# Patient Record
Sex: Male | Born: 1960 | Race: White | Hispanic: No | Marital: Married | Smoking: Former smoker
Health system: Southern US, Community
[De-identification: ages and names within clinical notes are randomized; demographics above are authoritative.]

## PROBLEM LIST (undated history)

## (undated) DIAGNOSIS — Z9889 Other specified postprocedural states: Secondary | ICD-10-CM

## (undated) DIAGNOSIS — H269 Unspecified cataract: Secondary | ICD-10-CM

## (undated) DIAGNOSIS — S61219A Laceration without foreign body of unspecified finger without damage to nail, initial encounter: Secondary | ICD-10-CM

## (undated) HISTORY — DX: Other specified postprocedural states: Z98.890

## (undated) HISTORY — DX: Unspecified cataract: H26.9

## (undated) HISTORY — DX: Laceration without foreign body of unspecified finger without damage to nail, initial encounter: S61.219A

---

## 2000-08-18 HISTORY — PX: FOOT SURGERY: SHX648

## 2005-01-01 ENCOUNTER — Ambulatory Visit (HOSPITAL_COMMUNITY): Admission: RE | Admit: 2005-01-01 | Discharge: 2005-01-01 | Payer: Self-pay | Admitting: *Deleted

## 2005-12-08 IMAGING — CR DG CHEST 2V
2 series · 2 of 2 positions shown · non-contrast
Comparison: none

CLINICAL DATA: Chest pain; shortness of breath; pre-cath work-up
 CHEST - 2 VIEW: 
 Hyperaerated lungs.  No active pulmonary process.  Normal cardiomediastinal silhouette size and contours.

[view not recorded (1 of 2)]
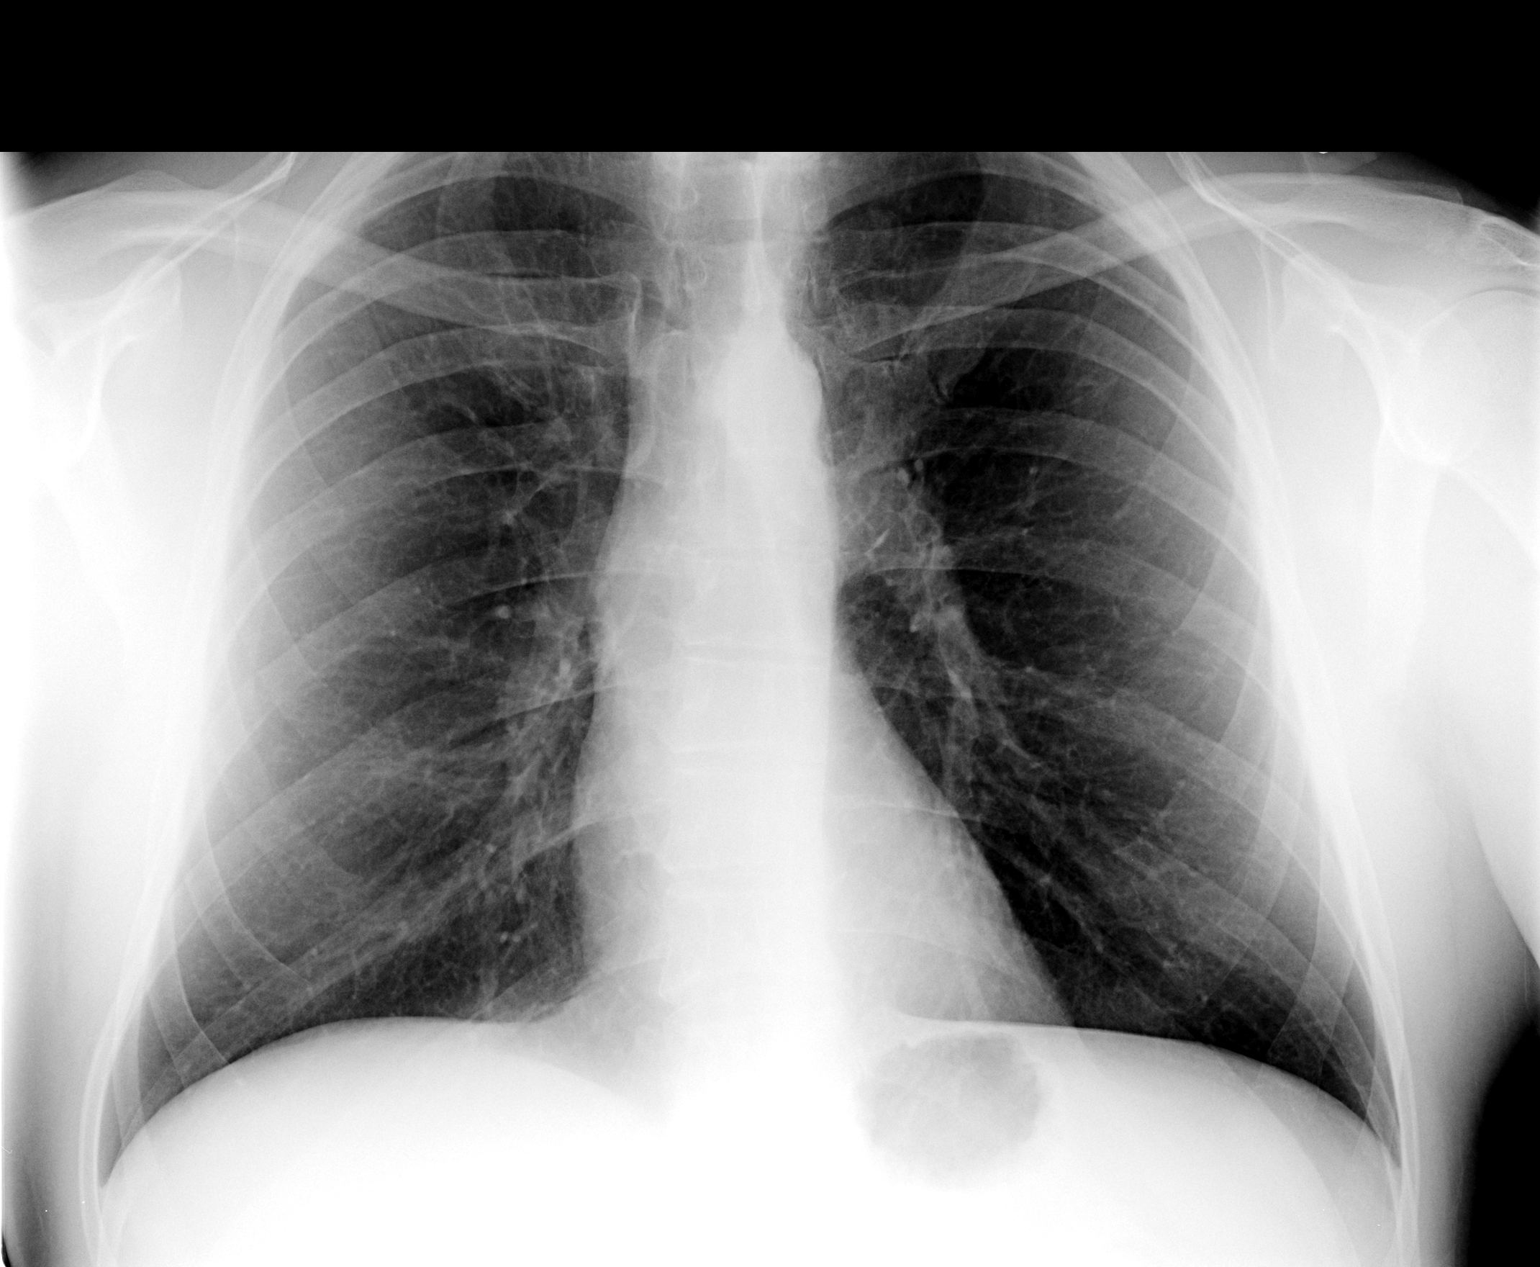

[view not recorded (2 of 2)]
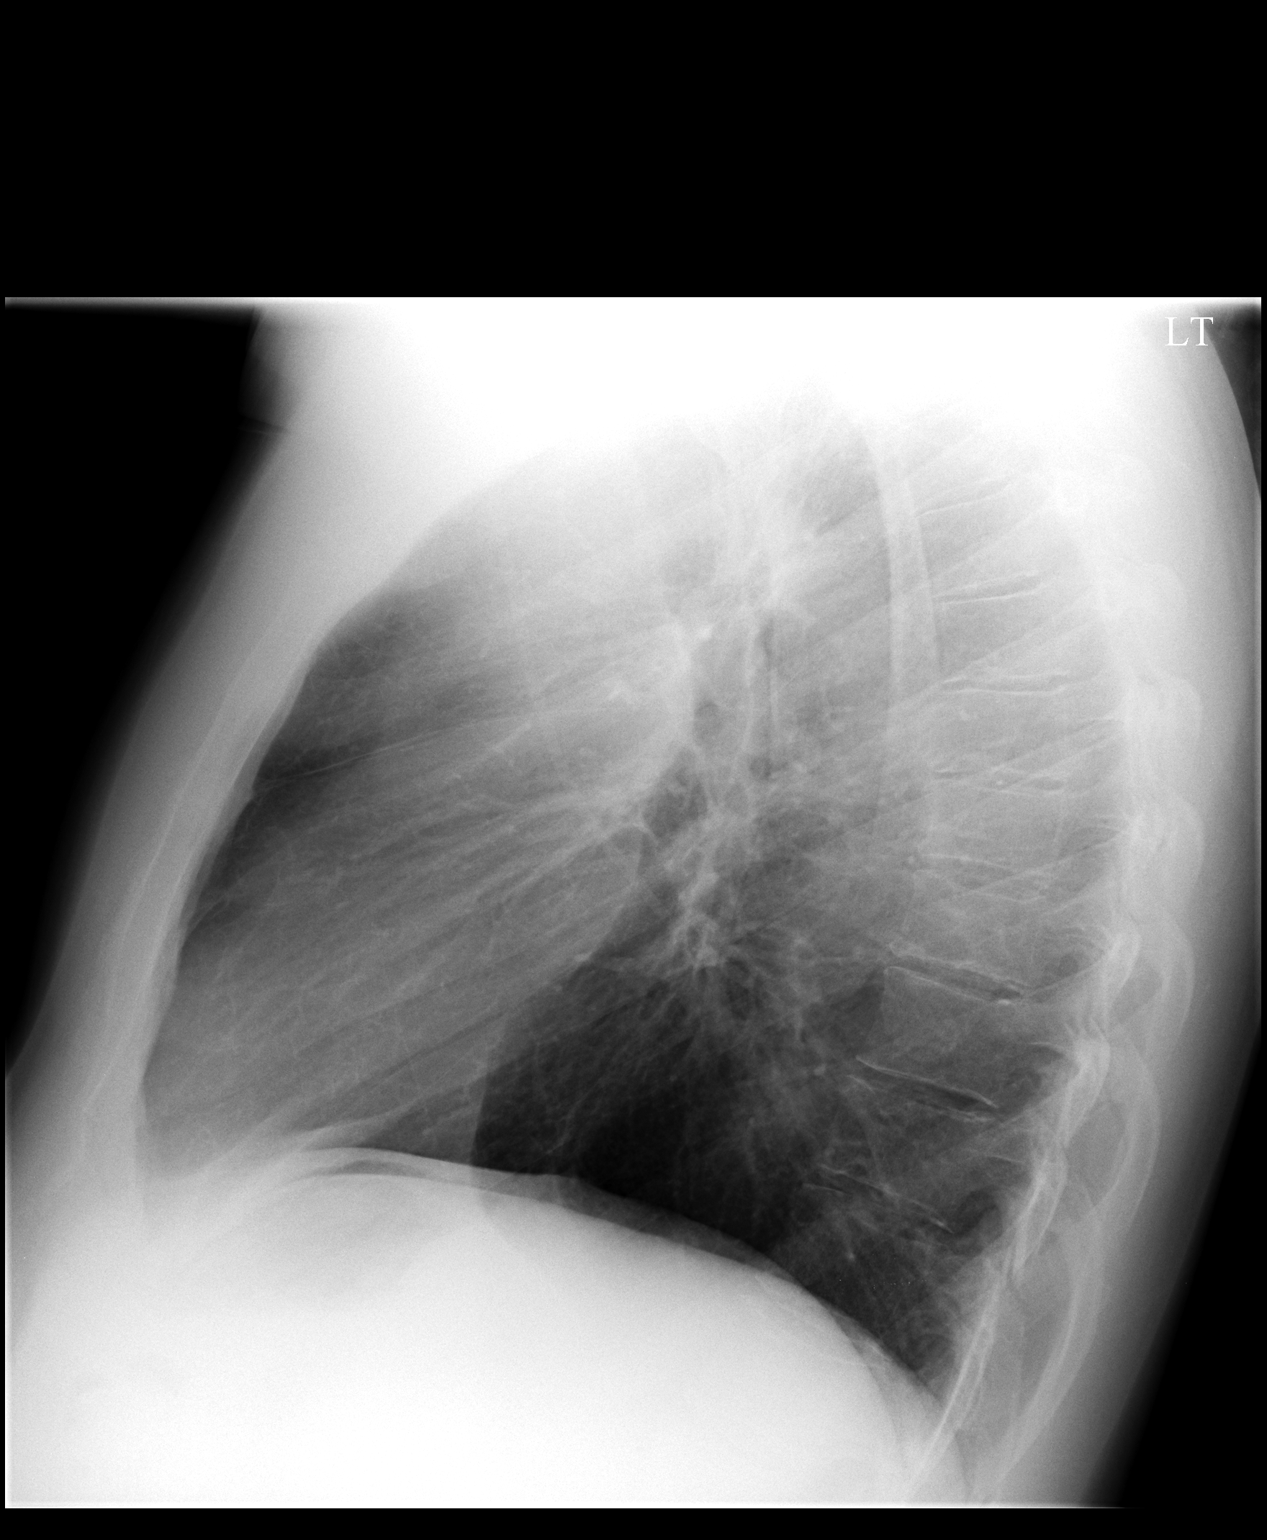

[2 of 2 positions shown; findings below may reference images not displayed]

IMPRESSION: Radiographic findings suspicious for COPD.  No acute chest disease.

## 2009-06-15 LAB — HM COLONOSCOPY

## 2012-05-20 ENCOUNTER — Ambulatory Visit: Payer: Self-pay | Admitting: Family Medicine

## 2015-06-08 DIAGNOSIS — M19011 Primary osteoarthritis, right shoulder: Secondary | ICD-10-CM | POA: Insufficient documentation

## 2015-06-27 DIAGNOSIS — G8929 Other chronic pain: Secondary | ICD-10-CM | POA: Insufficient documentation

## 2015-06-27 DIAGNOSIS — M25511 Pain in right shoulder: Secondary | ICD-10-CM

## 2015-12-13 DIAGNOSIS — H903 Sensorineural hearing loss, bilateral: Secondary | ICD-10-CM | POA: Insufficient documentation

## 2015-12-13 DIAGNOSIS — H9319 Tinnitus, unspecified ear: Secondary | ICD-10-CM | POA: Insufficient documentation

## 2016-09-09 DIAGNOSIS — K2100 Gastro-esophageal reflux disease with esophagitis, without bleeding: Secondary | ICD-10-CM | POA: Insufficient documentation

## 2016-09-09 DIAGNOSIS — L409 Psoriasis, unspecified: Secondary | ICD-10-CM | POA: Insufficient documentation

## 2016-09-09 DIAGNOSIS — E291 Testicular hypofunction: Secondary | ICD-10-CM | POA: Insufficient documentation

## 2016-09-10 ENCOUNTER — Encounter: Payer: Self-pay | Admitting: Family Medicine

## 2016-09-10 ENCOUNTER — Ambulatory Visit
Admission: RE | Admit: 2016-09-10 | Discharge: 2016-09-10 | Disposition: A | Discharge status: Home / Self Care | Payer: Managed Care, Other (non HMO) | Source: Ambulatory Visit | Attending: Family Medicine | Admitting: Family Medicine

## 2016-09-10 ENCOUNTER — Ambulatory Visit (INDEPENDENT_AMBULATORY_CARE_PROVIDER_SITE_OTHER): Payer: Managed Care, Other (non HMO) | Admitting: Family Medicine

## 2016-09-10 VITALS — BP 110/80 | HR 72 | Temp 98.3°F | Resp 16 | Ht 73.0 in | Wt 203.0 lb

## 2016-09-10 DIAGNOSIS — Z5181 Encounter for therapeutic drug level monitoring: Secondary | ICD-10-CM | POA: Diagnosis not present

## 2016-09-10 DIAGNOSIS — M940 Chondrocostal junction syndrome [Tietze]: Secondary | ICD-10-CM

## 2016-09-10 DIAGNOSIS — L409 Psoriasis, unspecified: Secondary | ICD-10-CM | POA: Diagnosis not present

## 2016-09-10 DIAGNOSIS — Z0000 Encounter for general adult medical examination without abnormal findings: Secondary | ICD-10-CM

## 2016-09-10 DIAGNOSIS — M545 Low back pain: Secondary | ICD-10-CM | POA: Insufficient documentation

## 2016-09-10 DIAGNOSIS — E7800 Pure hypercholesterolemia, unspecified: Secondary | ICD-10-CM | POA: Diagnosis not present

## 2016-09-10 DIAGNOSIS — Z1211 Encounter for screening for malignant neoplasm of colon: Secondary | ICD-10-CM

## 2016-09-10 LAB — POCT URINALYSIS DIPSTICK
Bilirubin, UA: NEGATIVE
Glucose, UA: NEGATIVE
Ketones, UA: NEGATIVE
Leukocytes, UA: NEGATIVE
NITRITE UA: NEGATIVE
PH UA: 6
RBC UA: NEGATIVE
SPEC GRAV UA: 1.025
UROBILINOGEN UA: 0.2

## 2016-09-10 LAB — IFOBT (OCCULT BLOOD): IMMUNOLOGICAL FECAL OCCULT BLOOD TEST: NEGATIVE

## 2016-09-10 MED ORDER — NAPROXEN 500 MG PO TABS: 500.0000 mg | ORAL_TABLET | Freq: Two times a day (BID) | ORAL | 5 refills | Status: DC

## 2016-09-10 NOTE — Progress Notes (Signed)
Patient: Lee Sanchez, Male    DOB: 12/25/1960, 56 y.o.   MRN: [id number] Visit Date: 09/10/2016  Today's Provider: Megan Mansichard Shaleen Talamantez Jr, MD   Chief Complaint  Patient presents with  . Annual Exam   Subjective:    Annual physical exam Lee Sanchez is a 56 y.o. male who presents today for health maintenance and complete physical. He feels fairly well. He reports he is not exercising at present. He reports he is sleeping well.  Last colonoscopy- 06/15/2009- internal hemorrhoids. Repeat 10 years. Last labs were checked 01/17/2014. Pt refuses flu vaccine today.  -----------------------------------------------------------------   Review of Systems  Constitutional: Negative.   HENT: Positive for congestion, hearing loss, sneezing and tinnitus. Negative for dental problem, drooling, ear discharge, ear pain, facial swelling, mouth sores, nosebleeds, postnasal drip, rhinorrhea, sinus pain, sinus pressure, sore throat, trouble swallowing and voice change.   Eyes: Positive for visual disturbance. Negative for photophobia, pain, discharge, redness and itching.  Respiratory: Negative.   Cardiovascular: Negative.   Gastrointestinal: Negative.   Endocrine: Negative.   Genitourinary: Negative.   Musculoskeletal: Positive for back pain. Negative for arthralgias, gait problem, joint swelling, myalgias, neck pain and neck stiffness.  Skin: Positive for rash (psoriasis). Negative for color change, pallor and wound.  Allergic/Immunologic: Negative.   Neurological: Negative.   Hematological: Negative.   Psychiatric/Behavioral: Negative.     Social History      He  reports that he quit smoking about 29 years ago. He quit smokeless tobacco use about 39 years ago. He reports that he drinks alcohol. He reports that he does not use drugs.       Social History   Social History  . Marital status: Married    Spouse name: Melissa  . Number of children: 2  . Years of education: some college    Occupational History  . Pharmaceutical distributer     [employer]   Social History Main Topics  . Smoking status: Former Smoker    Quit date: 08/18/1987  . Smokeless tobacco: Former NeurosurgeonUser    Quit date: 08/17/1977     Comment: Was former social smoker  . Alcohol use Yes     Comment: social  . Drug use: No  . Sexual activity: Yes   Other Topics Concern  . None   Social History Narrative  . None    History reviewed. No pertinent past medical history.   Patient Active Problem List   Diagnosis Date Noted  . Esophagitis, reflux 09/09/2016  . 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 Psoriasis 09/09/2016  . 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 Tinnitus 12/13/2015  . Chronic right shoulder pain 06/27/2015  . 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 HLD (hyperlipidemia) 04/11/2009    Past Surgical History:  Procedure Laterality Date  . FOOT SURGERY Left 2002    Family History        Family Status  Relation Status  . Mother Alive  . Father Deceased at age 56   MI  . Sister Alive  . Brother Alive  . Sister Alive        His family history includes Asthma in his mother; Healthy in his brother, sister, and sister; Heart attack in his father; Heart disease in his mother.     No Known Allergies   Current Outpatient Prescriptions:  .  arginine 500 MG tablet, Take 500 mg by mouth daily., Disp: , Rfl:  .  ENBREL SURECLICK 50  MG/ML injection, , Disp: , Rfl:  .  PANAX GINSENG PO, Take by mouth., Disp: , Rfl:    Patient Care Team: Maple Hudson., MD as PCP - General (Family Medicine)      Objective:   Vitals: BP 110/80 (BP Location: Right Arm, Patient Position: Sitting, Cuff Size: Normal)   Pulse 72   Temp 98.3 F (36.8 C) (Oral)   Resp 16   Ht 6\' 1"  (1.854 m)   Wt 203 lb (92.1 kg)   BMI 26.78 kg/m    Physical Exam  Constitutional: He is oriented to person, place, and time. He appears well-developed and well-nourished.  No distress.  HENT:  Head: Normocephalic and atraumatic.  Right Ear: External ear normal.  Left Ear: External ear normal.  Nose: Nose normal.  Mouth/Throat: Oropharynx is clear and moist. No oropharyngeal exudate.  Bilateral hearing aids  Eyes: Conjunctivae and EOM are normal. Pupils are equal, round, and reactive to light. Right eye exhibits no discharge. Left eye exhibits no discharge.  Neck: Normal range of motion. Neck supple. No tracheal deviation present. No thyromegaly present.  Cardiovascular: Normal rate, regular rhythm and normal heart sounds.   Pulmonary/Chest: Effort normal and breath sounds normal. No respiratory distress.  Abdominal: Soft. Bowel sounds are normal. He exhibits no distension. There is no tenderness.  Genitourinary: Rectum normal, prostate normal and penis normal. Rectal exam shows guaiac negative stool.  Musculoskeletal: Normal range of motion. He exhibits no edema.  Lymphadenopathy:    He has no cervical adenopathy.  Neurological: He is alert and oriented to person, place, and time. He has normal reflexes.  Skin: Skin is warm and dry. He is not diaphoretic.  Psychiatric: He has a normal mood and affect. His behavior is normal. Judgment and thought content normal.     Depression Screen PHQ 2/9 Scores 09/10/2016  PHQ - 2 Score 0      Assessment & Plan:     Routine Health Maintenance and Physical Exam  Exercise Activities and Dietary recommendations Goals    None      Immunization History  Administered Date(s) Administered  . Tdap 04/11/2009    Health Maintenance  Topic Date Due  . Hepatitis C Screening  17-Aug-1961  . HIV Screening  01/18/1976  . TETANUS/TDAP  01/18/1980  . COLONOSCOPY  01/18/2011  . INFLUENZA VACCINE  03/18/2016     Discussed health benefits of physical activity, and encouraged him to engage in regular exercise appropriate for his age and condition.      -------------------------------------------------------------------- 1. Annual physical exam Stable. As above. FU in 1 year. - POCT urinalysis dipstick Results for orders placed or performed in visit on 09/10/16  POCT urinalysis dipstick  Result Value Ref Range   Color, UA Amber    Clarity, UA Clear    Glucose, UA Negative    Bilirubin, UA Negative    Ketones, UA Negative    Spec Grav, UA 1.025    Blood, UA Negative    pH, UA 6.0    Protein, UA Trace    Urobilinogen, UA 0.2    Nitrite, UA Negative    Leukocytes, UA Negative Negative  IFOBT POC (occult bld, rslt in office)  Result Value Ref Range   IFOBT Negative      2. Psoriasis F/B Dr. Roseanne Kaufman at [facility]. Will fax lab results as requested by Dr. Roseanne Kaufman to [phone] when resulted. - Quantiferon tb gold assay (blood) - Hepatitis C antibody - Hepatitis B surface  antigen - Hepatitis B core antibody, total - Hepatitis B surface antibody  3. Encounter for therapeutic drug level monitoring Will fax results as above. - Quantiferon tb gold assay (blood) - Hepatitis C antibody - Hepatitis B surface antigen - Hepatitis B core antibody, total - Hepatitis B surface antibody  4. Pure hypercholesterolemia Pt has H/O this. Not currently being treated. Will check labs and FU pending results. - CBC with Differential/Platelet - Comprehensive metabolic panel - TSH - Lipid panel  5. Low back pain, unspecified back pain laterality, unspecified chronicity, with sciatica presence unspecified Order xray as below. - DG Lumbar Spine Complete  6. Costochondritis Start Naproxen as below. Call if pain does not improve/worsen. - naproxen (NAPROSYN) 500 MG tablet; Take 1 tablet (500 mg total) by mouth 2 (two) times daily with a meal.  Dispense: 60 tablet; Refill: 5  7. Colon cancer screening Negative. - IFOBT POC (occult bld, rslt in office)      Patient seen and examined by Julieanne Manson, MD, and note scribed  by Allene Dillon, CMA. I have done the exam and reviewed the above chart and it is accurate to the best of my knowledge. Dentist has been used in this note in any air is in the dictation or transcription are unintentional.   Megan Mans, MD  [facility]

## 2016-09-11 ENCOUNTER — Telehealth: Payer: Self-pay

## 2016-09-11 LAB — COMPREHENSIVE METABOLIC PANEL
A/G RATIO: 1.6 (ref 1.2–2.2)
ALK PHOS: 61 IU/L (ref 39–117)
ALT: 20 IU/L (ref 0–44)
AST: 14 IU/L (ref 0–40)
Albumin: 4.7 g/dL (ref 3.5–5.5)
BILIRUBIN TOTAL: 0.7 mg/dL (ref 0.0–1.2)
BUN/Creatinine Ratio: 16 (ref 9–20)
BUN: 18 mg/dL (ref 6–24)
CALCIUM: 9.8 mg/dL (ref 8.7–10.2)
CHLORIDE: 102 mmol/L (ref 96–106)
CO2: 26 mmol/L (ref 18–29)
Creatinine, Ser: 1.16 mg/dL (ref 0.76–1.27)
GFR calc Af Amer: 81 mL/min/{1.73_m2} (ref >59)
GFR, EST NON AFRICAN AMERICAN: 70 mL/min/{1.73_m2} (ref >59)
GLOBULIN, TOTAL: 2.9 g/dL (ref 1.5–4.5)
Glucose: 102 mg/dL — ABNORMAL HIGH (ref 65–99)
POTASSIUM: 4.8 mmol/L (ref 3.5–5.2)
SODIUM: 142 mmol/L (ref 134–144)
Total Protein: 7.6 g/dL (ref 6.0–8.5)

## 2016-09-11 LAB — CBC WITH DIFFERENTIAL/PLATELET
BASOS: 0 %
Basophils Absolute: 0 10*3/uL (ref 0.0–0.2)
EOS (ABSOLUTE): 0.1 10*3/uL (ref 0.0–0.4)
EOS: 2 %
HEMATOCRIT: 46.4 % (ref 37.5–51.0)
Hemoglobin: 16.2 g/dL (ref 13.0–17.7)
Immature Grans (Abs): 0 10*3/uL (ref 0.0–0.1)
Immature Granulocytes: 0 %
LYMPHS ABS: 2.6 10*3/uL (ref 0.7–3.1)
Lymphs: 44 %
MCH: 32.2 pg (ref 26.6–33.0)
MCHC: 34.9 g/dL (ref 31.5–35.7)
MCV: 92 fL (ref 79–97)
MONOS ABS: 0.7 10*3/uL (ref 0.1–0.9)
Monocytes: 11 %
NEUTROS ABS: 2.5 10*3/uL (ref 1.4–7.0)
NEUTROS PCT: 43 %
PLATELETS: 235 10*3/uL (ref 150–379)
RBC: 5.03 x10E6/uL (ref 4.14–5.80)
RDW: 13.7 % (ref 12.3–15.4)
WBC: 5.9 10*3/uL (ref 3.4–10.8)

## 2016-09-11 LAB — HEPATITIS B SURFACE ANTIBODY,QUALITATIVE: HEP B SURFACE AB, QUAL: NONREACTIVE

## 2016-09-11 LAB — HEPATITIS C ANTIBODY: Hep C Virus Ab: <0.1 s/co ratio (ref 0.0–0.9)

## 2016-09-11 LAB — LIPID PANEL
CHOLESTEROL TOTAL: 254 mg/dL — AB (ref 100–199)
Chol/HDL Ratio: 5.6 ratio units — ABNORMAL HIGH (ref 0.0–5.0)
HDL: 45 mg/dL (ref >39)
LDL CALC: 180 mg/dL — AB (ref 0–99)
Triglycerides: 145 mg/dL (ref 0–149)
VLDL Cholesterol Cal: 29 mg/dL (ref 5–40)

## 2016-09-11 LAB — HEPATITIS B CORE ANTIBODY, TOTAL: HEP B C TOTAL AB: NEGATIVE

## 2016-09-11 LAB — HEPATITIS B SURFACE ANTIGEN: Hepatitis B Surface Ag: NEGATIVE

## 2016-09-11 LAB — TSH: TSH: 2.64 u[IU]/mL (ref 0.450–4.500)

## 2016-09-11 NOTE — Telephone Encounter (Signed)
-----   Message from Maple Hudsonichard L Gilbert Jr., MD sent at 09/11/2016  8:13 AM EST ----- Labs okay except for high cholesterol. Work on diet and exercise. May need to consider treating if this does not come down.

## 2016-09-11 NOTE — Telephone Encounter (Signed)
Patient advised as directed below.  Thanks,  -Layten Aiken 

## 2016-09-15 ENCOUNTER — Encounter: Payer: Self-pay | Admitting: Family Medicine

## 2016-11-03 DIAGNOSIS — N529 Male erectile dysfunction, unspecified: Secondary | ICD-10-CM | POA: Insufficient documentation

## 2017-08-17 IMAGING — CR DG LUMBAR SPINE COMPLETE 4+V
1 series · 5 of 5 positions shown · non-contrast
Comparison: None.

CLINICAL DATA: Low back pain for about 1 year, no known injury

EXAM:
LUMBAR SPINE - COMPLETE 4+ VIEW

[Series 1: dg lumbar spine complete 4 +v · 0.14mm/px · 5 of 5 slices shown]
[im 1/5]
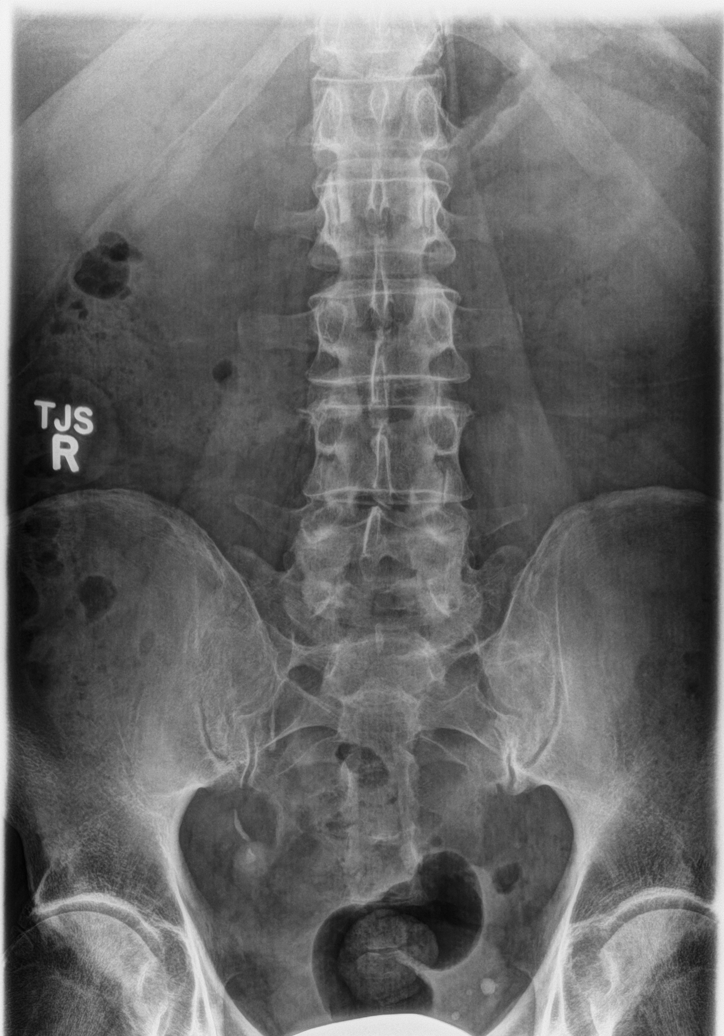
[im 2/5]
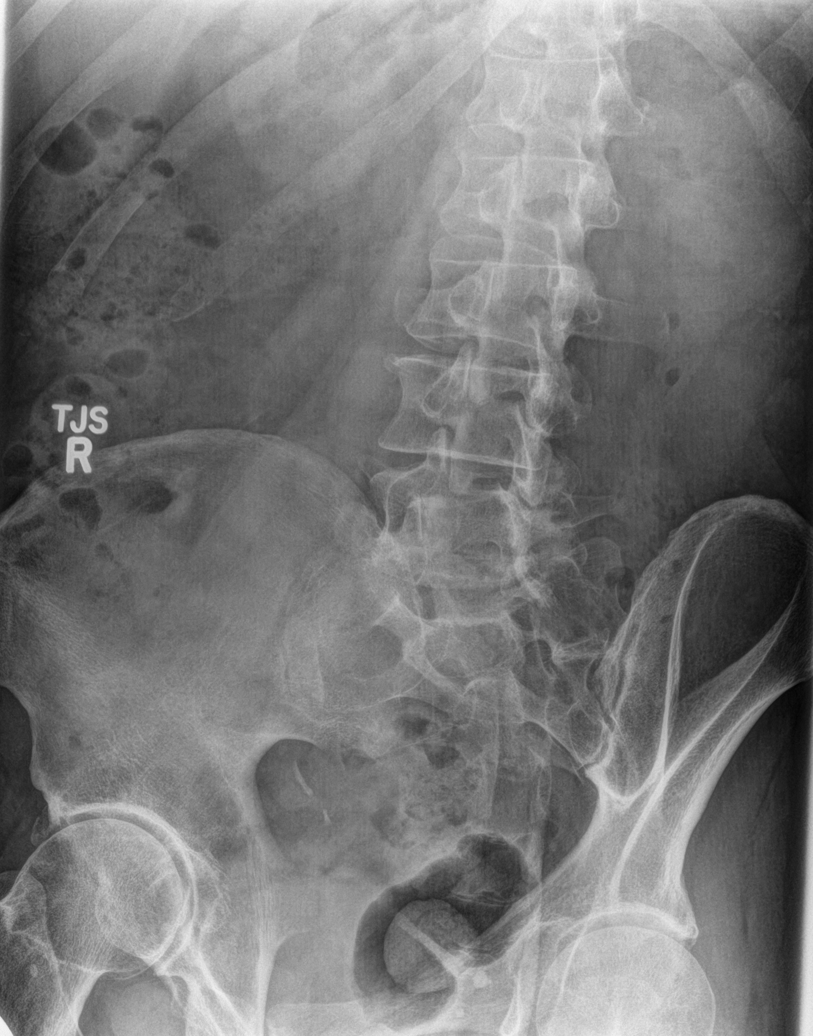
[im 3/5]
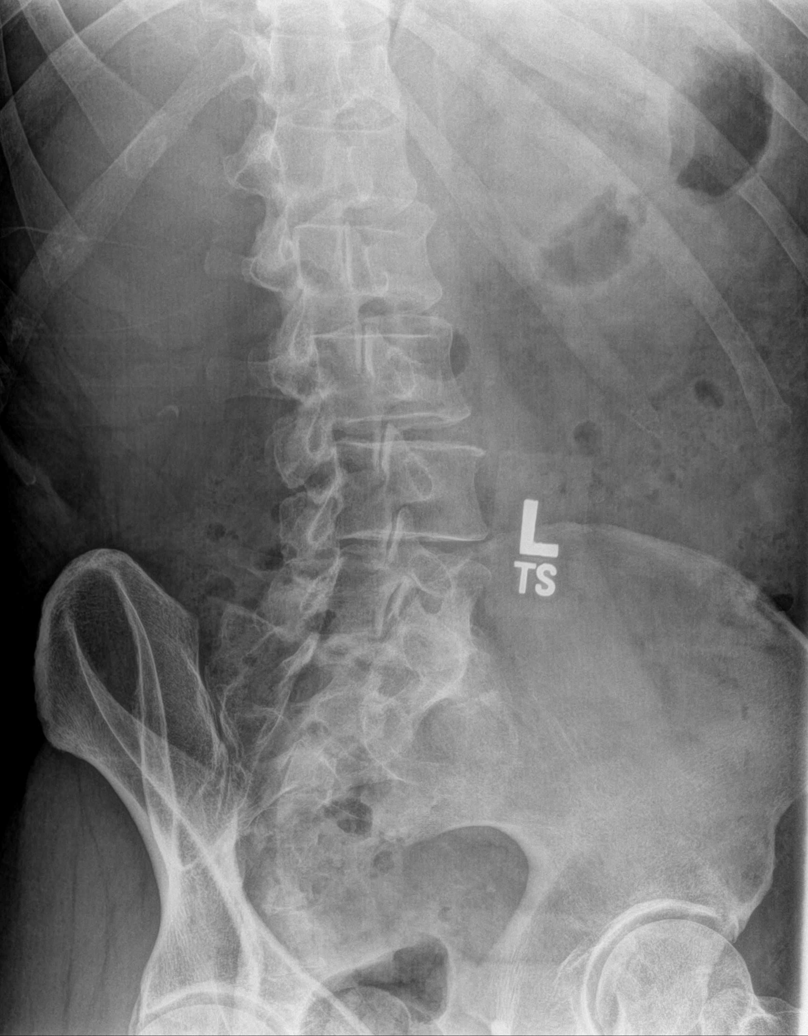
[im 4/5]
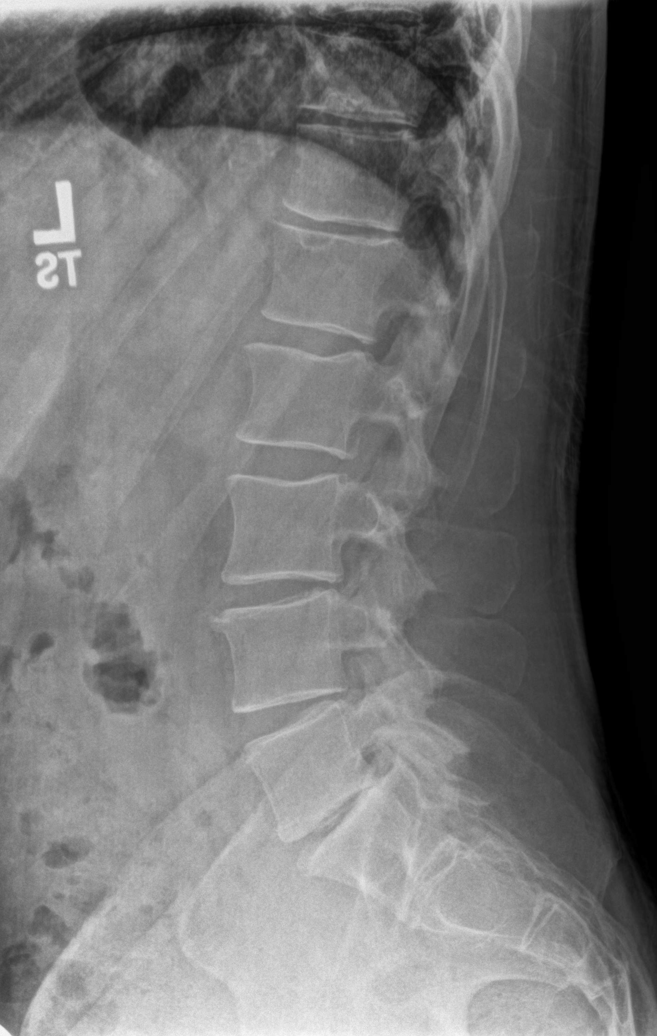
[im 5/5]
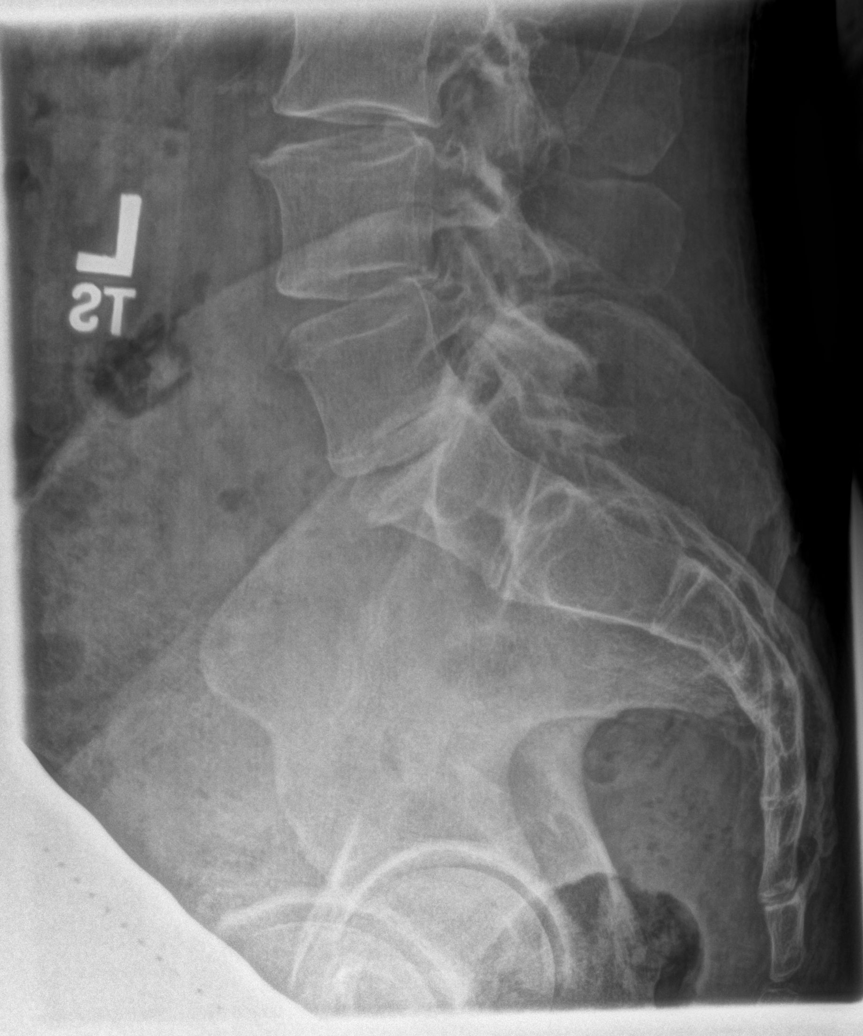

[5 of 5 positions shown; findings below may reference images not displayed]

FINDINGS: Five views of the lumbar spine submitted. No acute fracture or
subluxation. There is Schmorl's node deformity upper endplate of L1
vertebral body. Minimal disc space flattening at T12-L1 level.
Minimal disc space flattening at L3-L4 level. Mild anterior spurring
upper endplate of L4. Mild disc space flattening at L4-L5 level.
There is moderate disc space flattening with mild endplate sclerotic
changes at L5-S1 level. Facet degenerative changes noted L5 level.
IMPRESSION: No acute fracture or subluxation. Degenerative changes as described
above.

## 2017-09-14 ENCOUNTER — Encounter: Payer: Self-pay | Admitting: Family Medicine

## 2017-09-14 ENCOUNTER — Ambulatory Visit (INDEPENDENT_AMBULATORY_CARE_PROVIDER_SITE_OTHER): Payer: Managed Care, Other (non HMO) | Admitting: Family Medicine

## 2017-09-14 ENCOUNTER — Other Ambulatory Visit: Payer: Self-pay

## 2017-09-14 VITALS — BP 120/88 | HR 84 | Temp 98.7°F | Resp 12 | Ht 73.0 in | Wt 205.2 lb

## 2017-09-14 DIAGNOSIS — Z1211 Encounter for screening for malignant neoplasm of colon: Secondary | ICD-10-CM | POA: Diagnosis not present

## 2017-09-14 DIAGNOSIS — Z0000 Encounter for general adult medical examination without abnormal findings: Secondary | ICD-10-CM

## 2017-09-14 DIAGNOSIS — Z125 Encounter for screening for malignant neoplasm of prostate: Secondary | ICD-10-CM | POA: Diagnosis not present

## 2017-09-14 DIAGNOSIS — N529 Male erectile dysfunction, unspecified: Secondary | ICD-10-CM | POA: Diagnosis not present

## 2017-09-14 DIAGNOSIS — Z2821 Immunization not carried out because of patient refusal: Secondary | ICD-10-CM

## 2017-09-14 DIAGNOSIS — R739 Hyperglycemia, unspecified: Secondary | ICD-10-CM

## 2017-09-14 LAB — IFOBT (OCCULT BLOOD): IFOBT: NEGATIVE

## 2017-09-14 MED ORDER — SILDENAFIL CITRATE 25 MG PO TABS: 25.0000 mg | ORAL_TABLET | ORAL | 5 refills | Status: DC

## 2017-09-14 NOTE — Progress Notes (Signed)
Patient: Lee Sanchez, Male    DOB: 01-24-1961, 57 y.o.   MRN: [id number] Visit Date: 09/14/2017  Today's Provider: Megan Mans, MD   Chief Complaint  Patient presents with  . Annual Exam   Subjective:  Lee Sanchez is a 57 y.o. male who presents today for health maintenance and complete physical. He feels fairly well. He reports exercising not at this time. He reports he is sleeping fairly well. Patient declines flu shot. Last colonoscopy was 06/15/2009-hemorrhoids, repeat in 2020.  Review of Systems  Constitutional: Negative.   HENT: Positive for hearing loss (wearing hearing aids), sinus pressure, sneezing and tinnitus.   Eyes: Positive for itching and visual disturbance.  Respiratory: Negative.   Cardiovascular: Negative.   Gastrointestinal: Negative.   Endocrine: Positive for cold intolerance.  Genitourinary: Negative.        Some ED per pt.  Musculoskeletal: Positive for back pain.  Skin: Positive for rash.  Allergic/Immunologic: Negative.   Neurological: Negative.   Hematological: Negative.   Psychiatric/Behavioral: Negative.     Social History   Socioeconomic History  . Marital status: Married    Spouse name: Melissa  . Number of children: 2  . Years of education: some college  . Highest education level: Not on file  Social Needs  . Financial resource strain: Not on file  . Food insecurity - worry: Not on file  . Food insecurity - inability: Not on file  . Transportation needs - medical: Not on file  . Transportation needs - non-medical: Not on file  Occupational History  . Occupation: Customer service manager    Comment: [employer]  Tobacco Use  . Smoking status: Former Smoker    Types: Cigarettes    Last attempt to quit: 08/18/1987    Years since quitting: 30.0  . Smokeless tobacco: Former Neurosurgeon    Quit date: 08/17/1977  . Tobacco comment: Was former social smoker  Substance and Sexual Activity  . Alcohol use: Yes    Comment: social  .  Drug use: No  . Sexual activity: Yes  Other Topics Concern  . Not on file  Social History Narrative  . Not on file    Patient Active Problem List   Diagnosis Date Noted  . Erectile dysfunction 11/03/2016  . Esophagitis, reflux 09/09/2016  . 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 Psoriasis 09/09/2016  . 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 Tinnitus 12/13/2015  . Chronic right shoulder pain 06/27/2015  . 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 HLD (hyperlipidemia) 04/11/2009    Past Surgical History:  Procedure Laterality Date  . FOOT SURGERY Left 2002    His family history includes Asthma in his mother; Healthy in his brother, sister, and sister; Heart attack in his father; Heart disease in his mother.     Outpatient Encounter Medications as of 09/14/2017  Medication Sig  . ENBREL SURECLICK 50 MG/ML injection   . naproxen (NAPROSYN) 500 MG tablet Take 1 tablet (500 mg total) by mouth 2 (two) times daily with a meal.  . [DISCONTINUED] arginine 500 MG tablet Take 500 mg by mouth daily.  . [DISCONTINUED] PANAX GINSENG PO Take by mouth.   No facility-administered encounter medications on file as of 09/14/2017.     Patient Care Team: Maple Hudson., MD as PCP - General (Family Medicine)      Objective:   Vitals:  Vitals:   09/14/17 0906  BP: 120/88  Pulse: 84  Resp: 12  Temp: 98.7  F (37.1 C)  Weight: 205 lb 3.2 oz (93.1 kg)  Height: 6\' 1"  (1.854 m)    Physical Exam  Constitutional: He is oriented to person, place, and time. He appears well-developed and well-nourished.  HENT:  Head: Normocephalic and atraumatic.  Right Ear: External ear normal.  Nose: Nose normal.  Mouth/Throat: Oropharynx is clear and moist.  Eyes: Conjunctivae are normal. No scleral icterus.  Neck: No thyromegaly present.  Cardiovascular: Normal rate, regular rhythm and normal heart sounds.  Pulmonary/Chest: Effort normal and breath sounds normal.   Abdominal: Soft.  Genitourinary: Rectum normal, prostate normal and penis normal.  Musculoskeletal: Normal range of motion.  Lymphadenopathy:    He has no cervical adenopathy.  Neurological: He is alert and oriented to person, place, and time.  Skin: Skin is warm and dry.  Psychiatric: He has a normal mood and affect. His behavior is normal. Judgment and thought content normal.     Depression Screen PHQ 2/9 Scores 09/14/2017 09/10/2016  PHQ - 2 Score 0 0  PHQ- 9 Score 1 -   Assessment & Plan:    1. Annual physical exam RTC 1 year. - CBC with Differential/Platelet - Comprehensive metabolic panel - TSH - Lipid Panel With LDL/HDL Ratio  2. Colon cancer screening - IFOBT POC (occult bld, rslt in office)  3. Prostate cancer screening - PSA  4. Hyperglycemia - HgB A1c  5. Erectile dysfunction, unspecified erectile dysfunction type 6. Influenza vaccination declined by patient  HPI, Exam and A&P transcribed by Domingo CockingAnastasiya Hopkins, RMA under direction and in the presence of Julieanne Mansonichard Emori Kamau, MD. I have done the exam and reviewed the chart and it is accurate to the best of my knowledge. DentistDragon  technology has been used and  any errors in dictation or transcription are unintentional. Julieanne Mansonichard Laurella Tull M.D. [facility]

## 2017-09-15 ENCOUNTER — Telehealth: Payer: Self-pay

## 2017-09-15 LAB — LIPID PANEL WITH LDL/HDL RATIO
Cholesterol, Total: 225 mg/dL — ABNORMAL HIGH (ref 100–199)
HDL: 41 mg/dL (ref >39)
LDL Calculated: 150 mg/dL — ABNORMAL HIGH (ref 0–99)
LDL/HDL RATIO: 3.7 ratio — AB (ref 0.0–3.6)
Triglycerides: 168 mg/dL — ABNORMAL HIGH (ref 0–149)
VLDL CHOLESTEROL CAL: 34 mg/dL (ref 5–40)

## 2017-09-15 LAB — COMPREHENSIVE METABOLIC PANEL
A/G RATIO: 2 (ref 1.2–2.2)
ALBUMIN: 4.5 g/dL (ref 3.5–5.5)
ALK PHOS: 56 IU/L (ref 39–117)
ALT: 14 IU/L (ref 0–44)
AST: 10 IU/L (ref 0–40)
BUN / CREAT RATIO: 19 (ref 9–20)
BUN: 23 mg/dL (ref 6–24)
Bilirubin Total: 0.6 mg/dL (ref 0.0–1.2)
CO2: 25 mmol/L (ref 20–29)
CREATININE: 1.21 mg/dL (ref 0.76–1.27)
Calcium: 9.3 mg/dL (ref 8.7–10.2)
Chloride: 103 mmol/L (ref 96–106)
GFR calc Af Amer: 77 mL/min/{1.73_m2} (ref >59)
GFR, EST NON AFRICAN AMERICAN: 67 mL/min/{1.73_m2} (ref >59)
GLOBULIN, TOTAL: 2.3 g/dL (ref 1.5–4.5)
Glucose: 93 mg/dL (ref 65–99)
POTASSIUM: 4.4 mmol/L (ref 3.5–5.2)
SODIUM: 144 mmol/L (ref 134–144)
Total Protein: 6.8 g/dL (ref 6.0–8.5)

## 2017-09-15 LAB — CBC WITH DIFFERENTIAL/PLATELET
BASOS: 0 %
Basophils Absolute: 0 10*3/uL (ref 0.0–0.2)
EOS (ABSOLUTE): 0.1 10*3/uL (ref 0.0–0.4)
EOS: 2 %
HEMATOCRIT: 45.6 % (ref 37.5–51.0)
HEMOGLOBIN: 15.7 g/dL (ref 13.0–17.7)
Immature Grans (Abs): 0 10*3/uL (ref 0.0–0.1)
Immature Granulocytes: 0 %
LYMPHS ABS: 2.1 10*3/uL (ref 0.7–3.1)
Lymphs: 38 %
MCH: 31.9 pg (ref 26.6–33.0)
MCHC: 34.4 g/dL (ref 31.5–35.7)
MCV: 93 fL (ref 79–97)
MONOCYTES: 10 %
MONOS ABS: 0.5 10*3/uL (ref 0.1–0.9)
NEUTROS ABS: 2.8 10*3/uL (ref 1.4–7.0)
Neutrophils: 50 %
Platelets: 211 10*3/uL (ref 150–379)
RBC: 4.92 x10E6/uL (ref 4.14–5.80)
RDW: 13.6 % (ref 12.3–15.4)
WBC: 5.5 10*3/uL (ref 3.4–10.8)

## 2017-09-15 LAB — HEMOGLOBIN A1C
Est. average glucose Bld gHb Est-mCnc: 117 mg/dL
Hgb A1c MFr Bld: 5.7 % — ABNORMAL HIGH (ref 4.8–5.6)

## 2017-09-15 LAB — TSH: TSH: 3.36 u[IU]/mL (ref 0.450–4.500)

## 2017-09-15 LAB — PSA: Prostate Specific Ag, Serum: 1 ng/mL (ref 0.0–4.0)

## 2017-09-15 NOTE — Telephone Encounter (Signed)
[facility]   ----- Message from Maple Hudsonichard L Gilbert Jr., MD sent at 09/15/2017  9:24 AM EST ----- Stable.

## 2017-09-15 NOTE — Telephone Encounter (Signed)
-----   Message from Maple Hudsonichard L Gilbert Jr., MD sent at 09/15/2017  9:24 AM EST ----- Stable.

## 2017-09-15 NOTE — Telephone Encounter (Signed)
Patient advised of stable labs.  

## 2017-09-21 NOTE — Telephone Encounter (Signed)
Advised patient of results.  

## 2017-11-09 ENCOUNTER — Other Ambulatory Visit: Payer: Self-pay | Admitting: Family Medicine

## 2017-11-09 DIAGNOSIS — M940 Chondrocostal junction syndrome [Tietze]: Secondary | ICD-10-CM

## 2018-07-22 DIAGNOSIS — G459 Transient cerebral ischemic attack, unspecified: Secondary | ICD-10-CM | POA: Insufficient documentation

## 2018-07-23 DIAGNOSIS — I6502 Occlusion and stenosis of left vertebral artery: Secondary | ICD-10-CM | POA: Insufficient documentation

## 2018-07-23 DIAGNOSIS — Q2112 Patent foramen ovale: Secondary | ICD-10-CM | POA: Insufficient documentation

## 2018-08-23 ENCOUNTER — Ambulatory Visit (INDEPENDENT_AMBULATORY_CARE_PROVIDER_SITE_OTHER): Payer: Managed Care, Other (non HMO) | Admitting: Family Medicine

## 2018-08-23 VITALS — BP 128/92 | HR 69 | Temp 98.0°F | Resp 16 | Wt 201.0 lb

## 2018-08-23 DIAGNOSIS — R7303 Prediabetes: Secondary | ICD-10-CM | POA: Diagnosis not present

## 2018-08-23 DIAGNOSIS — I639 Cerebral infarction, unspecified: Secondary | ICD-10-CM | POA: Diagnosis not present

## 2018-08-23 DIAGNOSIS — I10 Essential (primary) hypertension: Secondary | ICD-10-CM | POA: Diagnosis not present

## 2018-08-23 DIAGNOSIS — E7800 Pure hypercholesterolemia, unspecified: Secondary | ICD-10-CM | POA: Diagnosis not present

## 2018-08-23 DIAGNOSIS — M545 Low back pain, unspecified: Secondary | ICD-10-CM

## 2018-08-23 DIAGNOSIS — G459 Transient cerebral ischemic attack, unspecified: Secondary | ICD-10-CM

## 2018-08-23 DIAGNOSIS — G8929 Other chronic pain: Secondary | ICD-10-CM

## 2018-08-23 NOTE — Progress Notes (Signed)
Lee Sanchez  MRN: [id number] DOB: 07-12-1961  Subjective:  HPI   The patient is a 58 year old male who presents for follow up of hospitalization.  Patient was admitted, diagnosed and treated for stroke at [facility] on 07/21/18 and was discharged on 07/23/18.  He was advised to follow up with outpatient stroke neurology and an appointment was made with Dr Shanda Howells. He had trouble using his right arm and that has resolved completely.Slow speech also resolved. Patient was discharged on Aspirin 81 mg daily, Plavix 75 mg daily for 3 months and Atorvastatin 80 mg daily.  Patient Active Problem List   Diagnosis Date Noted  . Erectile dysfunction 11/03/2016  . Esophagitis, reflux 09/09/2016  . 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 Psoriasis 09/09/2016  . 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 Tinnitus 12/13/2015  . Chronic right shoulder pain 06/27/2015  . 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 HLD (hyperlipidemia) 04/11/2009    Past Medical History:  Diagnosis Date  . Cataract    bilateral  . Finger laceration involving tendon   . S/P foot surgery     Social History   Socioeconomic History  . Marital status: Married    Spouse name: Melissa  . Number of children: 2  . Years of education: some college  . Highest education level: Not on file  Occupational History  . Occupation: Customer service manager    Comment: [employer]  Social Needs  . Financial resource strain: Not on file  . Food insecurity:    Worry: Not on file    Inability: Not on file  . Transportation needs:    Medical: Not on file    Non-medical: Not on file  Tobacco Use  . Smoking status: Former Smoker    Types: Cigarettes    Last attempt to quit: 08/18/1987    Years since quitting: 31.0  . Smokeless tobacco: Former Neurosurgeon    Quit date: 08/17/1977  . Tobacco comment: Was former social smoker  Substance and Sexual Activity  . Alcohol use: Yes    Comment: social   . Drug use: No  . Sexual activity: Yes  Lifestyle  . Physical activity:    Days per week: Not on file    Minutes per session: Not on file  . Stress: Not on file  Relationships  . Social connections:    Talks on phone: Not on file    Gets together: Not on file    Attends religious service: Not on file    Active member of club or organization: Not on file    Attends meetings of clubs or organizations: Not on file    Relationship status: Not on file  . Intimate partner violence:    Fear of current or ex partner: Not on file    Emotionally abused: Not on file    Physically abused: Not on file    Forced sexual activity: Not on file  Other Topics Concern  . Not on file  Social History Narrative  . Not on file    Outpatient Encounter Medications as of 08/23/2018  Medication Sig  . atorvastatin (LIPITOR) 80 MG tablet Take 80 mg by mouth daily.  . clopidogrel (PLAVIX) 75 MG tablet Take 75 mg by mouth daily.  Elgie Collard SURECLICK 50 MG/ML injection   . sildenafil (VIAGRA) 25 MG tablet Take 1 tablet (25 mg total) by mouth as directed. 1-4 daily prn  . naproxen (NAPROSYN) 500 MG tablet TAKE 1 TABLET (500  MG TOTAL) BY MOUTH 2 (TWO) TIMES DAILY WITH A MEAL. (Patient not taking: Reported on 08/23/2018)   No facility-administered encounter medications on file as of 08/23/2018.     No Known Allergies  Review of Systems  Constitutional: Negative for fever and malaise/fatigue.  Respiratory: Negative for cough, shortness of breath and wheezing.   Cardiovascular: Negative for chest pain, palpitations, orthopnea, claudication and leg swelling.  Gastrointestinal: Negative.   Musculoskeletal: Negative.   Neurological: Positive for tingling (just in the right corner of his mouth). Negative for dizziness, tremors, sensory change, speech change, focal weakness, weakness and headaches.  Endo/Heme/Allergies: Negative.   Psychiatric/Behavioral: Negative.     Objective:  BP (!) 128/92 (BP Location:  Right Arm, Patient Position: Sitting, Cuff Size: Normal)   Pulse 69   Temp 98 F (36.7 C) (Oral)   Resp 16   Wt 201 lb (91.2 kg)   SpO2 95%   BMI 26.52 kg/m   Physical Exam  Constitutional: He is oriented to person, place, and time and well-developed, well-nourished, and in no distress.  HENT:  Head: Normocephalic and atraumatic.  Right Ear: External ear normal.  Left Ear: External ear normal.  Nose: Nose normal.  Mouth/Throat: Oropharynx is clear and moist.  Eyes: Conjunctivae are normal. No scleral icterus.  Neck: No thyromegaly present.  Cardiovascular: Normal rate, regular rhythm, normal heart sounds and intact distal pulses.  Pulmonary/Chest: Effort normal and breath sounds normal.  Abdominal: Soft.  Musculoskeletal:        General: No edema.  Neurological: He is alert and oriented to person, place, and time. Gait normal. GCS score is 15.  Skin: Skin is warm and dry.  Psychiatric: Mood, memory, affect and judgment normal.    Assessment and Plan :   1. Cerebrovascular accident (CVA), unspecified mechanism (HCC) Is actually a TIA.  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  All risk factors will be treated aggressively. - Lipid Panel With LDL/HDL Ratio  2. Pure hypercholesterolemia  - Lipid Panel With LDL/HDL Ratio  3. Essential hypertension  - Comprehensive metabolic panel  4. Borderline diabetes  - Hemoglobin A1c - Comprehensive metabolic panel  5. Chronic low back pain without sciatica, unspecified back pain laterality  - Ambulatory referral to Physical Therapy  6. TIA (transient ischemic attack)      HPI, Exam and A&P Transcribed under the direction and in the presence of Julieanne Manson, Montez Hageman., MD. Electronically Signed: Janey Greaser, RMA  I have done the exam and reviewed the chart and it is accurate to the best of my knowledge. Dentist has been used and   any errors in dictation or transcription are unintentional. Julieanne Manson M.D. [facility]

## 2018-08-24 LAB — COMPREHENSIVE METABOLIC PANEL
A/G RATIO: 1.8 (ref 1.2–2.2)
ALK PHOS: 76 IU/L (ref 39–117)
ALT: 66 IU/L — AB (ref 0–44)
AST: 23 IU/L (ref 0–40)
Albumin: 4.6 g/dL (ref 3.5–5.5)
BILIRUBIN TOTAL: 0.9 mg/dL (ref 0.0–1.2)
BUN/Creatinine Ratio: 16 (ref 9–20)
BUN: 20 mg/dL (ref 6–24)
CHLORIDE: 102 mmol/L (ref 96–106)
CO2: 25 mmol/L (ref 20–29)
Calcium: 10 mg/dL (ref 8.7–10.2)
Creatinine, Ser: 1.28 mg/dL — ABNORMAL HIGH (ref 0.76–1.27)
GFR calc Af Amer: 71 mL/min/{1.73_m2} (ref >59)
GFR calc non Af Amer: 62 mL/min/{1.73_m2} (ref >59)
GLOBULIN, TOTAL: 2.5 g/dL (ref 1.5–4.5)
Glucose: 96 mg/dL (ref 65–99)
POTASSIUM: 4.3 mmol/L (ref 3.5–5.2)
SODIUM: 144 mmol/L (ref 134–144)
Total Protein: 7.1 g/dL (ref 6.0–8.5)

## 2018-08-24 LAB — LIPID PANEL WITH LDL/HDL RATIO
Cholesterol, Total: 120 mg/dL (ref 100–199)
HDL: 42 mg/dL
LDL Calculated: 55 mg/dL (ref 0–99)
LDl/HDL Ratio: 1.3 ratio (ref 0.0–3.6)
Triglycerides: 116 mg/dL (ref 0–149)
VLDL Cholesterol Cal: 23 mg/dL (ref 5–40)

## 2018-08-24 LAB — HEMOGLOBIN A1C
Est. average glucose Bld gHb Est-mCnc: 117 mg/dL
Hgb A1c MFr Bld: 5.7 % — ABNORMAL HIGH (ref 4.8–5.6)

## 2018-08-25 ENCOUNTER — Telehealth: Payer: Self-pay

## 2018-08-25 NOTE — Telephone Encounter (Signed)
-----   Message from Maple Hudson., MD sent at 08/25/2018  8:24 AM EST ----- Labs stable.  Cholesterol better.  Stay on all meds.

## 2018-08-25 NOTE — Telephone Encounter (Signed)
LVMTRC 

## 2018-08-25 NOTE — Telephone Encounter (Signed)
Advised patient of results.  

## 2018-09-03 ENCOUNTER — Telehealth: Payer: Self-pay | Admitting: Family Medicine

## 2018-09-03 NOTE — Telephone Encounter (Signed)
I sent over a form for [facility] to be signed on 08/27/18.I haven't gotten this back. Does patient still need referral ?

## 2018-09-03 NOTE — Telephone Encounter (Signed)
Dr. Sullivan Lone Do you have this form.  I did not see it come in and don't have a copy Thanks [address]

## 2018-09-07 ENCOUNTER — Ambulatory Visit (INDEPENDENT_AMBULATORY_CARE_PROVIDER_SITE_OTHER): Payer: Managed Care, Other (non HMO) | Admitting: Physician Assistant

## 2018-09-07 ENCOUNTER — Telehealth: Payer: Self-pay

## 2018-09-07 ENCOUNTER — Encounter: Payer: Self-pay | Admitting: Physician Assistant

## 2018-09-07 VITALS — BP 149/96 | HR 97 | Temp 98.0°F | Resp 16 | Wt 200.5 lb

## 2018-09-07 DIAGNOSIS — J0100 Acute maxillary sinusitis, unspecified: Secondary | ICD-10-CM | POA: Diagnosis not present

## 2018-09-07 DIAGNOSIS — I639 Cerebral infarction, unspecified: Secondary | ICD-10-CM | POA: Diagnosis not present

## 2018-09-07 MED ORDER — AMOXICILLIN 875 MG PO TABS: 875.0000 mg | ORAL_TABLET | Freq: Two times a day (BID) | ORAL | 0 refills | Status: AC

## 2018-09-07 MED ORDER — PREDNISONE 10 MG PO TABS: 10.0000 mg | ORAL_TABLET | Freq: Every day | ORAL | 0 refills | Status: AC

## 2018-09-07 NOTE — Progress Notes (Signed)
Patient: Lee Sanchez Male    DOB: 03-Oct-1960   58 y.o.   MRN: [id number] Visit Date: 09/08/2018  Today's Provider: Trey Sailors, PA-C   Chief Complaint  Patient presents with  . Headache   Subjective:     HPI Patient with history of stroke in 07/2018 and high grade left vertebral artery stenosis and left sided sinus congestion on CT head 07/2018 presents here today c/o head ache on left side since last x 3 days. Patient reports that he has a head ache on and off since last week. Patient reports taking Tylenol as needed without much relief. History of migraines previously and this does not feel ike migraine to him, Hasn't had migraine recently either. Reports his teeth hurt. Rates h/a 8/9 out of 10. Denies vision change, difficulty with speech, imbalance, numbness, tingling. Wife reports he blows his nose frequently in the morning, he does not take any allergy medications currently. Reports when he was on a flight earlier when he landed he had a sharp pain in his upper jaw area. He has been taking decongestants.   No Known Allergies   Current Outpatient Medications:  .  atorvastatin (LIPITOR) 80 MG tablet, Take 80 mg by mouth daily., Disp: , Rfl:  .  clopidogrel (PLAVIX) 75 MG tablet, Take 75 mg by mouth daily., Disp: , Rfl:  .  ENBREL SURECLICK 50 MG/ML injection, , Disp: , Rfl:  .  amoxicillin (AMOXIL) 875 MG tablet, Take 1 tablet (875 mg total) by mouth 2 (two) times daily for 7 days., Disp: 14 tablet, Rfl: 0 .  aspirin 81 MG tablet, , Disp: , Rfl:  .  predniSONE (DELTASONE) 10 MG tablet, Take 1 tablet (10 mg total) by mouth daily with breakfast for 5 days., Disp: 5 tablet, Rfl: 0  Review of Systems  Constitutional: Negative.   HENT: Positive for dental problem.   Respiratory: Positive for cough. Negative for chest tightness and shortness of breath.   Cardiovascular: Negative.  Negative for chest pain.  Neurological: Positive for headaches.    Social History    Tobacco Use  . Smoking status: Former Smoker    Types: Cigarettes    Last attempt to quit: 08/18/1987    Years since quitting: 31.0  . Smokeless tobacco: Former Neurosurgeon    Quit date: 08/17/1977  . Tobacco comment: Was former social smoker  Substance Use Topics  . Alcohol use: Yes    Comment: social      Objective:   BP (!) 149/96 (BP Location: Left Arm, Patient Position: Sitting, Cuff Size: Large)   Pulse 97   Temp 98 F (36.7 C) (Oral)   Resp 16   Wt 200 lb 8 oz (90.9 kg)   BMI 26.45 kg/m  Vitals:   09/07/18 1407  BP: (!) 149/96  Pulse: 97  Resp: 16  Temp: 98 F (36.7 C)  TempSrc: Oral  Weight: 200 lb 8 oz (90.9 kg)     Physical Exam Constitutional:      Appearance: He is well-developed.  HENT:     Mouth/Throat:     Mouth: Mucous membranes are moist.     Pharynx: Oropharynx is clear.  Eyes:     General: No visual field deficit.    Extraocular Movements: Extraocular movements intact.     Right eye: Normal extraocular motion and no nystagmus.     Left eye: Normal extraocular motion and no nystagmus.     Pupils: Pupils are  equal, round, and reactive to light. Pupils are equal.     Right eye: Pupil is round and reactive.     Left eye: Pupil is round and reactive.  Neck:     Musculoskeletal: Neck supple.  Cardiovascular:     Rate and Rhythm: Normal rate and regular rhythm.  Pulmonary:     Effort: Pulmonary effort is normal.     Breath sounds: Normal breath sounds.  Lymphadenopathy:     Cervical: No cervical adenopathy.  Skin:    General: Skin is warm and dry.  Neurological:     Mental Status: He is alert and oriented to person, place, and time.     GCS: GCS eye subscore is 4. GCS verbal subscore is 5. GCS motor subscore is 6.     Cranial Nerves: No cranial nerve deficit, dysarthria or facial asymmetry.     Motor: No weakness.     Coordination: Coordination normal.     Gait: Gait normal.     Deep Tendon Reflexes: Reflexes normal.  Psychiatric:         Mood and Affect: Mood normal.        Behavior: Behavior normal.         Assessment & Plan    1. Acute non-recurrent maxillary sinusitis  I have reviewed patient's imaging from his December admission at [facility] for stroke. I have reviewed his MRI brain and also CTA Head/Neck which showed left maxillary sinus congestion and left vertebral artery stenosis. He does not have any neurodeficits today. I do think his symptoms are more in line with a sinus infection rather than a neurologic event. His blood pressure is high today, which may be due to decongestants. I have counseled patient that he cannot have typical OTC decongestants as they are vasoconstricting. He should take coricidin and an allergy medication like an Careers adviser. Will also treat as below. He should check his blood pressure periodically after treatment for this illness is over and follow up sooner than his 12/2018 appointment if his BP is > 140/90 as he may need to be started on HTN medication. I have discussed this patient with Dr. Sullivan Lone.  - amoxicillin (AMOXIL) 875 MG tablet; Take 1 tablet (875 mg total) by mouth 2 (two) times daily for 7 days.  Dispense: 14 tablet; Refill: 0 - predniSONE (DELTASONE) 10 MG tablet; Take 1 tablet (10 mg total) by mouth daily with breakfast for 5 days.  Dispense: 5 tablet; Refill: 0  2. Cerebrovascular accident (CVA), unspecified mechanism (HCC)  Follow BP closer, patient declines designated 1 month follow up and wants to monitor his BP at home.   The entirety of the information documented in the History of Present Illness, Review of Systems and Physical Exam were personally obtained by me. Portions of this information were initially documented by Rondel Baton, CMA and reviewed by me for thoroughness and accuracy.   Return in about 1 month (around 10/08/2018) for BP.  I have spent 25 minutes with this patient, >50% of which was spent on counseling and coordination of care.       Trey Sailors,  PA-C  [facility]

## 2018-09-07 NOTE — Telephone Encounter (Signed)
Noted, thanks!

## 2018-09-07 NOTE — Patient Instructions (Addendum)
Coricidin - decongestant for people with high blood pressure.     Sinusitis, Adult Sinusitis is soreness and swelling (inflammation) of your sinuses. Sinuses are hollow spaces in the bones around your face. They are located:  Around your eyes.  In the middle of your forehead.  Behind your nose.  In your cheekbones. Your sinuses and nasal passages are lined with a fluid called mucus. Mucus drains out of your sinuses. Swelling can trap mucus in your sinuses. This lets germs (bacteria, virus, or fungus) grow, which leads to infection. Most of the time, this condition is caused by a virus. What are the causes? This condition is caused by:  Allergies.  Asthma.  Germs.  Things that block your nose or sinuses.  Growths in the nose (nasal polyps).  Chemicals or irritants in the air.  Fungus (rare). What increases the risk? You are more likely to develop this condition if:  You have a weak body defense system (immune system).  You do a lot of swimming or diving.  You use nasal sprays too much.  You smoke. What are the signs or symptoms? The main symptoms of this condition are pain and a feeling of pressure around the sinuses. Other symptoms include:  Stuffy nose (congestion).  Runny nose (drainage).  Swelling and warmth in the sinuses.  Headache.  Toothache.  A cough that may get worse at night.  Mucus that collects in the throat or the back of the nose (postnasal drip).  Being unable to smell and taste.  Being very tired (fatigue).  A fever.  Sore throat.  Bad breath. How is this diagnosed? This condition is diagnosed based on:  Your symptoms.  Your medical history.  A physical exam.  Tests to find out if your condition is short-term (acute) or long-term (chronic). Your doctor may: ? Check your nose for growths (polyps). ? Check your sinuses using a tool that has a light (endoscope). ? Check for allergies or germs. ? Do imaging tests, such as an  MRI or CT scan. How is this treated? Treatment for this condition depends on the cause and whether it is short-term or long-term.  If caused by a virus, your symptoms should go away on their own within 10 days. You may be given medicines to relieve symptoms. They include: ? Medicines that shrink swollen tissue in the nose. ? Medicines that treat allergies (antihistamines). ? A spray that treats swelling of the nostrils. ? Rinses that help get rid of thick mucus in your nose (nasal saline washes).  If caused by bacteria, your doctor may wait to see if you will get better without treatment. You may be given antibiotic medicine if you have: ? A very bad infection. ? A weak body defense system.  If caused by growths in the nose, you may need to have surgery. Follow these instructions at home: Medicines  Take, use, or apply over-the-counter and prescription medicines only as told by your doctor. These may include nasal sprays.  If you were prescribed an antibiotic medicine, take it as told by your doctor. Do not stop taking the antibiotic even if you start to feel better. Hydrate and humidify   Drink enough water to keep your pee (urine) pale yellow.  Use a cool mist humidifier to keep the humidity level in your home above 50%.  Breathe in steam for 10-15 minutes, 3-4 times a day, or as told by your doctor. You can do this in the bathroom while a hot shower  is running.  Try not to spend time in cool or dry air. Rest  Rest as much as you can.  Sleep with your head raised (elevated).  Make sure you get enough sleep each night. General instructions   Put a warm, moist washcloth on your face 3-4 times a day, or as often as told by your doctor. This will help with discomfort.  Wash your hands often with soap and water. If there is no soap and water, use hand sanitizer.  Do not smoke. Avoid being around people who are smoking (secondhand smoke).  Keep all follow-up visits as told  by your doctor. This is important. Contact a doctor if:  You have a fever.  Your symptoms get worse.  Your symptoms do not get better within 10 days. Get help right away if:  You have a very bad headache.  You cannot stop throwing up (vomiting).  You have very bad pain or swelling around your face or eyes.  You have trouble seeing.  You feel confused.  Your neck is stiff.  You have trouble breathing. Summary  Sinusitis is swelling of your sinuses. Sinuses are hollow spaces in the bones around your face.  This condition is caused by tissues in your nose that become inflamed or swollen. This traps germs. These can lead to infection.  If you were prescribed an antibiotic medicine, take it as told by your doctor. Do not stop taking it even if you start to feel better.  Keep all follow-up visits as told by your doctor. This is important. This information is not intended to replace advice given to you by your health care provider. Make sure you discuss any questions you have with your health care provider. Document Released: 01/21/2008 Document Revised: 01/04/2018 Document Reviewed: 01/04/2018 Elsevier Interactive Patient Education  2019 [employer].

## 2018-09-07 NOTE — Telephone Encounter (Signed)
Patients wife had called the office with concerns of patient complaining of headache for the past 3-4 days. This is a patient of Dr. Wonda OldsGilberts, wife informed me that patient had a stroke in December and is currently not on blood pressure medication. Wife states that patient complains of headache on left side and for the past 3 night blood pressure readings at home have been elevated at night. Systolic blood pressure ranging from 164-177 and diastolic blood pressure reading from 109-113. Wife states that only active medication patient is on right now is aspirin, Lipitor and Plavix. Patient denied symptoms of confusion, chest pain, visual disturbance, numbness or tingling of lower/upper extremities, shortness of breath, back pain or weakness. Patients wife reports that they contacted neurologist who instructed patient to contact his PCP for appointment. Dr. Sullivan LoneGilbert was booked for the day and I tried to book patient this morning to be seen by Nadine CountsBob for evaluation but patient refused to see Nadine CountsBob. Patient has been placed on your schedule today at 2Pm.

## 2018-09-18 ENCOUNTER — Other Ambulatory Visit: Payer: Self-pay | Admitting: Family Medicine

## 2018-12-03 ENCOUNTER — Other Ambulatory Visit: Payer: Self-pay | Admitting: Family Medicine

## 2018-12-27 ENCOUNTER — Ambulatory Visit (INDEPENDENT_AMBULATORY_CARE_PROVIDER_SITE_OTHER): Payer: Managed Care, Other (non HMO) | Admitting: Family Medicine

## 2018-12-27 ENCOUNTER — Other Ambulatory Visit: Payer: Self-pay

## 2018-12-27 ENCOUNTER — Encounter: Payer: Self-pay | Admitting: Family Medicine

## 2018-12-27 VITALS — BP 116/78 | HR 72 | Temp 98.2°F | Resp 16 | Wt 202.0 lb

## 2018-12-27 DIAGNOSIS — E7800 Pure hypercholesterolemia, unspecified: Secondary | ICD-10-CM | POA: Diagnosis not present

## 2018-12-27 DIAGNOSIS — I10 Essential (primary) hypertension: Secondary | ICD-10-CM | POA: Diagnosis not present

## 2018-12-27 DIAGNOSIS — L409 Psoriasis, unspecified: Secondary | ICD-10-CM

## 2018-12-27 DIAGNOSIS — M791 Myalgia, unspecified site: Secondary | ICD-10-CM | POA: Diagnosis not present

## 2018-12-27 DIAGNOSIS — Z0000 Encounter for general adult medical examination without abnormal findings: Secondary | ICD-10-CM

## 2018-12-27 NOTE — Progress Notes (Signed)
Patient: Lee Sanchez, Male    DOB: 11/10/1960, 58 y.o.   MRN: [id number] Visit Date: 12/27/2018  Today's Provider: Megan Mansichard Gilbert Jr, MD   Chief Complaint  Patient presents with  . Annual Exam   Subjective:     Annual physical exam Lee Sanchez is a 58 y.o. male who presents today for health maintenance and complete physical. He feels well. He reports exercising some. He reports he is sleeping well. Patient has been married for 31 years.  He works regularly.  He has 2 sons ages 5728 and 5722.  The youngest son graduates from college this month  Colonoscopy- 06/15/2009. Internal hemorrhoids, otherwise normal. Repeat 647yrs. Tdap- 04/11/2009.  Patient states that he did have a tetanus shot to his knowledge 2 years ago [facility].    Review of Systems  Constitutional: Negative.   HENT: Negative.   Eyes: Negative.   Respiratory: Negative.   Cardiovascular: Negative.   Gastrointestinal: Negative.   Endocrine: Negative.   Genitourinary: Negative.   Musculoskeletal: Positive for myalgias.  Allergic/Immunologic: Negative.   Neurological: Negative.   Hematological: Negative.   Psychiatric/Behavioral: Negative.     Social History      He  reports that he quit smoking about 31 years ago. His smoking use included cigarettes. He quit smokeless tobacco use about 41 years ago. He reports current alcohol use. He reports that he does not use drugs.       Social History   Socioeconomic History  . Marital status: Married    Spouse name: Lee Sanchez  . Number of children: 2  . Years of education: some college  . Highest education level: Not on file  Occupational History  . 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 Financial resource strain: Not on file  . Food insecurity:    Worry: Not on file    Inability: Not on file  . Transportation needs:    Medical: Not on file    Non-medical: Not on file  Tobacco Use  .  Smoking status: Former Smoker    Types: Cigarettes    Last attempt to quit: 08/18/1987    Years since quitting: 31.3  . Smokeless tobacco: Former NeurosurgeonUser    Quit date: 08/17/1977  . Tobacco comment: Was former social smoker  Substance and Sexual Activity  . Alcohol use: Yes    Comment: social  . Drug use: No  . Sexual activity: Yes  Lifestyle  . Physical activity:    Days per week: Not on file    Minutes per session: Not on file  . Stress: Not on file  Relationships  . Social connections:    Talks on phone: Not on file    Gets together: Not on file    Attends religious service: Not on file    Active member of club or organization: Not on file    Attends meetings of clubs or organizations: Not on file    Relationship status: Not on file  Other Topics Concern  . Not on file  Social History Narrative  . Not on file    Past Medical History:  Diagnosis Date  . Cataract    bilateral  . Finger laceration involving tendon   . S/P foot surgery      Patient Active Problem List   Diagnosis Date Noted  . Hypertension 08/23/2018  . Borderline diabetes 08/23/2018  . Erectile dysfunction 11/03/2016  .  Esophagitis, reflux 09/09/2016  . 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 Psoriasis 09/09/2016  . 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 Tinnitus 12/13/2015  . Chronic right shoulder pain 06/27/2015  . 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 HLD (hyperlipidemia) 04/11/2009    Past Surgical History:  Procedure Laterality Date  . FOOT SURGERY Left 2002    Family History        Family Status  Relation Name Status  . Mother  Deceased  . Father  Deceased at age 44       MI  . Sister  Alive  . Brother  Alive  . Sister  Alive        His family history includes Asthma in his mother; Healthy in his brother, sister, and sister; Heart attack in his father; Heart disease in his mother.      No Known Allergies   Current Outpatient Medications:   .  aspirin 81 MG tablet, , Disp: , Rfl:  .  atorvastatin (LIPITOR) 80 MG tablet, Take 80 mg by mouth daily., Disp: , Rfl:  .  sildenafil (VIAGRA) 25 MG tablet, TAKE 1-4 TABLETS (25 MG TOTAL) BY MOUTH AS DIRECTED. *PA DENIED*, Disp: 9 tablet, Rfl: 11 .  clopidogrel (PLAVIX) 75 MG tablet, Take 75 mg by mouth daily., Disp: , Rfl:  .  ENBREL SURECLICK 50 MG/ML injection, , Disp: , Rfl:  .  naproxen (NAPROSYN) 500 MG tablet, TAKE 1 TABLET (500 MG TOTAL) BY MOUTH 2 (TWO) TIMES DAILY WITH A MEAL. (Patient not taking: Reported on 12/27/2018), Disp: 60 tablet, Rfl: 3   Patient Care Team: Maple Hudson., MD as PCP - General (Family Medicine)    Objective:    Vitals: There were no vitals taken for this visit.  There were no vitals filed for this visit.   Physical Exam Vitals signs reviewed.  Constitutional:      Appearance: He is well-developed.  HENT:     Head: Normocephalic and atraumatic.     Right Ear: External ear normal.     Nose: Nose normal.  Eyes:     General: No scleral icterus.    Conjunctiva/sclera: Conjunctivae normal.  Neck:     Thyroid: No thyromegaly.  Cardiovascular:     Rate and Rhythm: Normal rate and regular rhythm.     Heart sounds: Normal heart sounds.  Pulmonary:     Effort: Pulmonary effort is normal.     Breath sounds: Normal breath sounds.  Abdominal:     Palpations: Abdomen is soft.  Genitourinary:    Penis: Normal.      Scrotum/Testes: Normal.  Musculoskeletal: Normal range of motion.  Lymphadenopathy:     Cervical: No cervical adenopathy.  Skin:    General: Skin is warm and dry.  Neurological:     Mental Status: He is alert and oriented to person, place, and time. Mental status is at baseline.  Psychiatric:        Mood and Affect: Mood normal.        Behavior: Behavior normal.        Thought Content: Thought content normal.        Judgment: Judgment normal.      Depression Screen PHQ 2/9 Scores 09/14/2017 09/10/2016  PHQ - 2 Score 0 0   PHQ- 9 Score 1 -       Assessment & Plan:     Routine Health Maintenance and Physical Exam  Exercise Activities and Dietary recommendations Goals  None     Immunization History  Administered Date(s) Administered  . Tdap 04/11/2009    Health Maintenance  Topic Date Due  . HIV Screening  01/18/1976  . INFLUENZA VACCINE  03/19/2019  . TETANUS/TDAP  04/12/2019  . COLONOSCOPY  06/16/2019  . Hepatitis C Screening  Completed     Discussed health benefits of physical activity, and encouraged him to engage in regular exercise appropriate for his age and condition. Refer for screening colonoscopy 1. Annual physical exam Recheck 1 year  2. Pure hypercholesterolemia Lipitor - TSH - CK (Creatine Kinase)  3. Essential hypertension Good BP - TSH  4. Myalgia Check CK on high-dose statin - CK (Creatine Kinase)  5. Psoriasis Per dermatology, Dr. Isa Rankin 6.Hearing Loss 7.h/o TIA Risk factors treated    Megan Mans, MD  [facility]

## 2018-12-28 ENCOUNTER — Telehealth: Payer: Self-pay

## 2018-12-28 LAB — TSH: TSH: 2.37 u[IU]/mL (ref 0.450–4.500)

## 2018-12-28 LAB — CK: Total CK: 133 U/L (ref 41–331)

## 2018-12-28 NOTE — Telephone Encounter (Signed)
LMTCB 12/28/2018  Thanks,   -Hersey Maclellan  

## 2018-12-28 NOTE — Telephone Encounter (Signed)
-----   Message from Maple Hudson., MD sent at 12/28/2018 11:22 AM EDT ----- Normal.

## 2018-12-30 NOTE — Telephone Encounter (Signed)
Patient was notified of results. Expressed understanding.  

## 2019-01-15 ENCOUNTER — Telehealth: Payer: Self-pay | Admitting: Nurse Practitioner

## 2019-01-15 ENCOUNTER — Inpatient Hospital Stay: Admission: RE | Admit: 2019-01-15 | Payer: Managed Care, Other (non HMO) | Source: Ambulatory Visit

## 2019-01-15 DIAGNOSIS — Z20822 Contact with and (suspected) exposure to covid-19: Secondary | ICD-10-CM

## 2019-01-15 NOTE — Progress Notes (Signed)
E-Visit for Corona Virus Screening   Based on your current symptoms, you may very well have the virus, however your symptoms are mild. Currently, not all patients are being tested. If the symptoms are mild and there is not a known exposure, performing the test is not indicated.  You have been enrolled in MyChart Home Monitoring for COVID-19. Daily you will receive a questionnaire within the MyChart website. Our COVID-19 response team will be monitoring your responses daily.   Coronavirus disease 2019 (COVID-19) is a respiratory illness that can spread from person to person. The virus that causes COVID-19 is a new virus that was first identified in the country of [place] but is now found in multiple other countries and has spread to the [place].  Symptoms associated with the virus are mild to severe fever, cough, and shortness of breath. There is currently no vaccine to protect against COVID-19, and there is no specific antiviral treatment for the virus.   To be considered HIGH RISK for Coronavirus (COVID-19), you have to meet the following criteria:  . Traveled to [place], [place], [place], [place] or [place]; or in the [place] to [place], [place], [place], or [place]; and have fever, cough, and shortness of breath within the last 2 weeks of travel OR  . Been in close contact with a person diagnosed with COVID-19 within the last 2 weeks and have fever, cough, and shortness of breath  . IF YOU DO NOT MEET THESE CRITERIA, YOU ARE CONSIDERED LOW RISK FOR COVID-19.   It is vitally important that if you feel that you have an infection such as this virus or any other virus that you stay home and away from places where you may spread it to others.  You should self-quarantine for 14 days if you have symptoms that could potentially be coronavirus and avoid contact with people age 65 and older.   You can use medication such as delsym or mucinex if develop a cough   You may also take  acetaminophen (Tylenol) as needed for fever.   Reduce your risk of any infection by using the same precautions used for avoiding the common cold or flu:  . Wash your hands often with soap and warm water for at least 20 seconds.  If soap and water are not readily available, use an alcohol-based hand sanitizer with at least 60% alcohol.  . If coughing or sneezing, cover your mouth and nose by coughing or sneezing into the elbow areas of your shirt or coat, into a tissue or into your sleeve (not your hands). . Avoid shaking hands with others and consider head nods or verbal greetings only. . Avoid touching your eyes, nose, or mouth with unwashed hands.  . Avoid close contact with people who are sick. . Avoid places or events with large numbers of people in one location, like concerts or sporting events. . Carefully consider travel plans you have or are making. . If you are planning any travel outside or inside the [place], visit the CDC's Travelers' Health webpage for the latest health notices. . If you have some symptoms but not all symptoms, continue to monitor at home and seek medical attention if your symptoms worsen. . If you are having a medical emergency, call 911.  HOME CARE . Only take medications as instructed by your medical team. . Drink plenty of fluids and get plenty of rest. . A steam or ultrasonic humidifier can help if you have congestion.     GET HELP RIGHT AWAY IF: . You develop worsening fever. . You become short of breath . You cough up blood. . Your symptoms become more severe MAKE SURE YOU   Understand these instructions.  Will watch your condition.  Will get help right away if you are not doing well or get worse.  Your e-visit answers were reviewed by a board certified advanced clinical practitioner to complete your personal care plan.  Depending on the condition, your plan could have included both over the counter or prescription medications.  If there is a problem  please reply once you have received a response from your provider. Your safety is important to us.  If you have drug allergies check your prescription carefully.    You can use MyChart to ask questions about today's visit, request a non-urgent call back, or ask for a work or school excuse for 24 hours related to this e-Visit. If it has been greater than 24 hours you will need to follow up with your provider, or enter a new e-Visit to address those concerns. You will get an e-mail in the next two days asking about your experience.  I hope that your e-visit has been valuable and will speed your recovery. Thank you for using e-visits.  5-10 minutes spent reviewing and documenting in chart.   

## 2019-08-02 ENCOUNTER — Other Ambulatory Visit: Payer: Self-pay | Admitting: Family Medicine

## 2019-08-17 ENCOUNTER — Other Ambulatory Visit: Payer: Self-pay | Admitting: Family Medicine

## 2019-08-17 DIAGNOSIS — E7800 Pure hypercholesterolemia, unspecified: Secondary | ICD-10-CM

## 2019-08-17 NOTE — Telephone Encounter (Signed)
Copied from [place] [id number]. Topic: Quick Communication - Rx Refill/Question >> Aug 17, 2019  4:38 PM Mcneil, Ja-Kwan wrote: Medication: atorvastatin (LIPITOR) 80 MG tablet  Has the patient contacted their pharmacy? yes   Preferred Pharmacy (with phone number or street name): [id number] [address], [place] Phone: [phone]  Fax: [phone]  Agent: Please be advised that RX refills may take up to 3 business days. We ask that you follow-up with your pharmacy.

## 2019-08-17 NOTE — Telephone Encounter (Signed)
Requested medication (s) are due for refill today: yes  Requested medication (s) are on the active medication list: yes  Last refill:  historic provider  Future visit scheduled: yes  Notes to clinic:  historic provider    Requested Prescriptions  Pending Prescriptions Disp Refills   atorvastatin (LIPITOR) 80 MG tablet       Sig: Take 1 tablet (80 mg total) by mouth daily.      Cardiovascular:  Antilipid - Statins Passed - 08/17/2019  4:42 PM      Passed - Total Cholesterol in normal range and within 360 days    Cholesterol, Total  Date Value Ref Range Status  08/23/2018 120 100 - 199 mg/dL Final          Passed - LDL in normal range and within 360 days    LDL Calculated  Date Value Ref Range Status  08/23/2018 55 0 - 99 mg/dL Final          Passed - HDL in normal range and within 360 days    HDL  Date Value Ref Range Status  08/23/2018 42 >39 mg/dL Final          Passed - Triglycerides in normal range and within 360 days    Triglycerides  Date Value Ref Range Status  08/23/2018 116 0 - 149 mg/dL Final          Passed - Patient is not pregnant      Passed - Valid encounter within last 12 months    Recent Outpatient Visits           7 months ago Annual physical exam   [facility] Jerrol Banana., MD   11 months ago Acute non-recurrent maxillary sinusitis   [facility] Carles Collet M, [place]   11 months ago Cerebrovascular accident (CVA), unspecified mechanism [facility])   [facility] Jerrol Banana., MD   1 year ago Annual physical exam   [facility] Jerrol Banana., MD   2 years ago Annual physical exam   [facility] Jerrol Banana., MD       Future Appointments             In 1 week Jerrol Banana., MD [facility], [place]

## 2019-08-18 MED ORDER — ATORVASTATIN CALCIUM 80 MG PO TABS: 80.0000 mg | ORAL_TABLET | Freq: Every day | ORAL | 3 refills | Status: AC

## 2019-08-18 NOTE — Telephone Encounter (Signed)
Medication never filled by Dr. Rosanna Randy. L.O.V. was on 12/27/2018 and upcoming appointment on 08/29/2019.

## 2019-08-26 NOTE — Progress Notes (Deleted)
       Patient: Lee Sanchez Male    DOB: 1960/09/17   59 y.o.   MRN: [id number] Visit Date: 08/29/2019  Today's Provider: Megan Mans, MD   No chief complaint on file.  Subjective:    Virtual Visit via Video Note  I connected with Lee Sanchez on 08/29/19 at  1:20 PM EST by a video enabled telemedicine application and verified that I am speaking with the correct person using two identifiers.  Location: Patient: *** Provider: ***   I discussed the limitations of evaluation and management by telemedicine and the availability of in person appointments. The patient expressed understanding and agreed to proceed.   HPI  No Known Allergies   Current Outpatient Medications:  .  aspirin 81 MG tablet, , Disp: , Rfl:  .  atorvastatin (LIPITOR) 80 MG tablet, Take 1 tablet (80 mg total) by mouth daily., Disp: 90 tablet, Rfl: 3 .  clopidogrel (PLAVIX) 75 MG tablet, Take 75 mg by mouth daily., Disp: , Rfl:  .  ENBREL SURECLICK 50 MG/ML injection, , Disp: , Rfl:  .  naproxen (NAPROSYN) 500 MG tablet, TAKE 1 TABLET (500 MG TOTAL) BY MOUTH 2 (TWO) TIMES DAILY WITH A MEAL., Disp: 60 tablet, Rfl: 1 .  sildenafil (VIAGRA) 25 MG tablet, TAKE 1-4 TABLETS (25 MG TOTAL) BY MOUTH AS DIRECTED. *PA DENIED*, Disp: 9 tablet, Rfl: 11  Review of Systems  Constitutional: Negative for appetite change, chills and fever.  Respiratory: Negative for chest tightness, shortness of breath and wheezing.   Cardiovascular: Negative for chest pain and palpitations.  Gastrointestinal: Negative for abdominal pain, nausea and vomiting.    Social History   Tobacco Use  . Smoking status: Former Smoker    Types: Cigarettes    Quit date: 08/18/1987    Years since quitting: 32.0  . Smokeless tobacco: Former Neurosurgeon    Quit date: 08/17/1977  . Tobacco comment: Was former social smoker  Substance Use Topics  . Alcohol use: Yes    Comment: social      Objective:   There were no vitals taken for this  visit. There were no vitals filed for this visit.There is no height or weight on file to calculate BMI.   Physical Exam   No results found for any visits on 08/29/19.     Assessment & Plan    I discussed the assessment and treatment plan with the patient. The patient was provided an opportunity to ask questions and all were answered. The patient agreed with the plan and demonstrated an understanding of the instructions.   The patient was advised to call back or seek an in-person evaluation if the symptoms worsen or if the condition fails to improve as anticipated.  I provided *** minutes of non-face-to-face time during this encounter.    Verbon Giangregorio Wendelyn Breslow, MD  [facility]

## 2019-08-29 ENCOUNTER — Encounter: Payer: Managed Care, Other (non HMO) | Admitting: Family Medicine

## 2019-09-16 ENCOUNTER — Other Ambulatory Visit: Payer: Self-pay | Admitting: Family Medicine

## 2019-12-06 ENCOUNTER — Other Ambulatory Visit: Payer: Self-pay | Admitting: Family Medicine

## 2019-12-21 NOTE — Progress Notes (Signed)
Complete physical exam  I,April Miller,acting as a scribe for Megan Mans, MD.,have documented all relevant documentation on the behalf of Megan Mans, MD,as directed by  Megan Mans, MD while in the presence of Megan Mans, MD.   Patient: Lee Sanchez   DOB: [birth year]   59 y.o. Male  MRN: [id number] Visit Date: 12/26/2019  Today's healthcare provider: Megan Mans, MD   Chief Complaint  Patient presents with  . Annual Exam   Subjective    Lee Sanchez is a 59 y.o. male who presents today for a complete physical exam.  He reports consuming a general diet. Home exercise routine includes walking. He generally feels well. He reports sleeping fairly well. He does not have additional problems to discuss today.  HPI  He sees Dr. Roseanne Kaufman for dermatology issues.  Past Medical History:  Diagnosis Date  . Cataract    bilateral  . Finger laceration involving tendon   . S/P foot surgery    Past Surgical History:  Procedure Laterality Date  . FOOT SURGERY Left 2002   Social History   Socioeconomic History  . Marital status: Married    Spouse name: Melissa  . Number of children: 2  . Years of education: some college  . Highest education level: Not on file  Occupational History  . Occupation: Customer service manager    Comment: [employer]  Tobacco Use  . Smoking status: Former Smoker    Types: Cigarettes    Quit date: 08/18/1987    Years since quitting: 32.3  . Smokeless tobacco: Former Neurosurgeon    Quit date: 08/17/1977  . Tobacco comment: Was former social smoker  Substance and Sexual Activity  . Alcohol use: Yes    Comment: social  . Drug use: No  . Sexual activity: Yes  Other Topics Concern  . Not on file  Social History Narrative  . Not on file   Social Determinants of Health   Financial Resource Strain:   . Difficulty of Paying Living Expenses:   Food Insecurity:   . Worried About Programme researcher, broadcasting/film/video in the Last  Year:   . Barista in the Last Year:   Transportation Needs:   . Freight forwarder (Medical):   Marland Kitchen Lack of Transportation (Non-Medical):   Physical Activity:   . Days of Exercise per Week:   . Minutes of Exercise per Session:   Stress:   . Feeling of Stress :   Social Connections:   . Frequency of Communication with Friends and Family:   . Frequency of Social Gatherings with Friends and Family:   . Attends Religious Services:   . Active Member of Clubs or Organizations:   . Attends Banker Meetings:   Marland Kitchen Marital Status:   Intimate Partner Violence:   . Fear of Current or Ex-Partner:   . Emotionally Abused:   Marland Kitchen Physically Abused:   . Sexually Abused:    Family Status  Relation Name Status  . Mother  Deceased  . Father  Deceased at age 20       MI  . Sister  Alive  . Brother  Alive  . Sister  Alive   Family History  Problem Relation Age of Onset  . Asthma Mother   . Heart disease Mother   . Heart attack Father   . Healthy Sister   . Healthy Brother   . Healthy Sister    No Known  Allergies  Patient Care Team: Maple Hudson., MD as PCP - General (Family Medicine)   Medications: Outpatient Medications Prior to Visit  Medication Sig  . aspirin 81 MG tablet   . atorvastatin (LIPITOR) 80 MG tablet Take 1 tablet (80 mg total) by mouth daily.  . [facility] ASPIRIN ADULT LOW DOSE 81 MG chewable tablet TAKE 1 TABLET BY MOUTH EVERY DAY  . ENBREL SURECLICK 50 MG/ML injection   . sildenafil (VIAGRA) 25 MG tablet TAKE 1-4 TABLETS (25 MG TOTAL) BY MOUTH AS DIRECTED. *PA DENIED*  . clopidogrel (PLAVIX) 75 MG tablet Take 75 mg by mouth daily.  . naproxen (NAPROSYN) 500 MG tablet TAKE 1 TABLET (500 MG TOTAL) BY MOUTH 2 (TWO) TIMES DAILY WITH A MEAL. (Patient not taking: Reported on 12/26/2019)   No facility-administered medications prior to visit.    Review of Systems  Constitutional: Negative.   HENT: Positive for hearing loss and tinnitus.   Eyes:  Negative.   Respiratory: Negative.   Cardiovascular: Negative.   Gastrointestinal: Negative.   Endocrine: Negative.   Musculoskeletal: Positive for back pain, neck pain and neck stiffness.  Skin: Positive for rash.  Allergic/Immunologic: Negative.   Hematological: Negative.   Psychiatric/Behavioral: Negative.        Objective    BP 132/88 (BP Location: Right Arm, Patient Position: Sitting, Cuff Size: Large)   Pulse 97   Temp (!) 96.9 F (36.1 C) (Other (Comment))   Resp 16   Ht 6\' 1"  (1.854 m)   Wt 203 lb (92.1 kg)   SpO2 96%   BMI 26.78 kg/m  BP Readings from Last 3 Encounters:  12/26/19 132/88  12/27/18 116/78  09/07/18 (!) 149/96   Wt Readings from Last 3 Encounters:  12/26/19 203 lb (92.1 kg)  12/27/18 202 lb (91.6 kg)  09/07/18 200 lb 8 oz (90.9 kg)      Physical Exam Vitals and nursing note reviewed.  Constitutional:      Appearance: Normal appearance. He is well-developed and normal weight.  HENT:     Head: Normocephalic and atraumatic.     Right Ear: Tympanic membrane and external ear normal.     Left Ear: Tympanic membrane normal.     Nose: Nose normal.     Mouth/Throat:     Mouth: Mucous membranes are moist.  Eyes:     General: No scleral icterus.    Conjunctiva/sclera: Conjunctivae normal.  Neck:     Thyroid: No thyromegaly.  Cardiovascular:     Rate and Rhythm: Normal rate and regular rhythm.     Pulses: Normal pulses.     Heart sounds: Normal heart sounds.  Pulmonary:     Effort: Pulmonary effort is normal.     Breath sounds: Normal breath sounds.  Abdominal:     General: Bowel sounds are normal.     Palpations: Abdomen is soft.  Genitourinary:    Penis: Normal.      Testes: Normal.  Musculoskeletal:        General: Normal range of motion.     Cervical back: Normal range of motion and neck supple.  Lymphadenopathy:     Cervical: No cervical adenopathy.  Skin:    General: Skin is warm and dry.  Neurological:     Mental Status: He  is alert and oriented to person, place, and time. Mental status is at baseline.  Psychiatric:        Mood and Affect: Mood normal.  Behavior: Behavior normal.        Thought Content: Thought content normal.        Judgment: Judgment normal.       Depression Screen  PHQ 2/9 Scores 12/26/2019 12/27/2018 09/14/2017  PHQ - 2 Score 2 0 0  PHQ- 9 Score 8 0 1    No results found for any visits on 12/26/19.  Assessment & Plan    Routine Health Maintenance and Physical Exam  Exercise Activities and Dietary recommendations Goals   None     Immunization History  Administered Date(s) Administered  . Tdap 04/11/2009, 02/15/2018    Health Maintenance  Topic Date Due  . HIV Screening  Never done  . COVID-19 Vaccine (1) Never done  . COLONOSCOPY  06/16/2019  . INFLUENZA VACCINE  03/18/2020  . TETANUS/TDAP  02/16/2028  . Hepatitis C Screening  Completed    Discussed health benefits of physical activity, and encouraged him to engage in regular exercise appropriate for his age and condition.  1. Annual physical exam  - Lipid panel - CBC w/Diff/Platelet - Comprehensive Metabolic Panel (CMET) - TSH - PSA - Hemoglobin A1C - POCT urinalysis dipstick-Normal  2. Essential hypertension  - Lipid panel - CBC w/Diff/Platelet - Comprehensive Metabolic Panel (CMET) - TSH - PSA - Hemoglobin A1C  3. Pure hypercholesterolemia  - Lipid panel - CBC w/Diff/Platelet - Comprehensive Metabolic Panel (CMET) - TSH - PSA - Hemoglobin A1C  4. Borderline diabetes  - Lipid panel - CBC w/Diff/Platelet - Comprehensive Metabolic Panel (CMET) - TSH - PSA - Hemoglobin A1C  5. Encounter for screening for HIV  - HIV antibody (with reflex)  6. Screening for colon cancer Last colonoscopy 06/15/2009. - Ambulatory referral to Gastroenterology   Return in about 6 months (around 06/27/2020).     I, Wilhemena Durie, MD, have reviewed all documentation for this visit. The  documentation on 12/31/19 for the exam, diagnosis, procedures, and orders are all accurate and complete.    Nakiea Metzner Cranford Mon, MD  [facility] [phone] (phone) [phone] (fax)  [place]

## 2019-12-26 ENCOUNTER — Ambulatory Visit (INDEPENDENT_AMBULATORY_CARE_PROVIDER_SITE_OTHER): Payer: Managed Care, Other (non HMO) | Admitting: Family Medicine

## 2019-12-26 ENCOUNTER — Other Ambulatory Visit: Payer: Self-pay

## 2019-12-26 ENCOUNTER — Encounter: Payer: Self-pay | Admitting: Family Medicine

## 2019-12-26 VITALS — BP 132/88 | HR 97 | Temp 96.9°F | Resp 16 | Ht 73.0 in | Wt 203.0 lb

## 2019-12-26 DIAGNOSIS — E7800 Pure hypercholesterolemia, unspecified: Secondary | ICD-10-CM

## 2019-12-26 DIAGNOSIS — Z0000 Encounter for general adult medical examination without abnormal findings: Secondary | ICD-10-CM

## 2019-12-26 DIAGNOSIS — I10 Essential (primary) hypertension: Secondary | ICD-10-CM

## 2019-12-26 DIAGNOSIS — R7303 Prediabetes: Secondary | ICD-10-CM

## 2019-12-26 DIAGNOSIS — Z1211 Encounter for screening for malignant neoplasm of colon: Secondary | ICD-10-CM

## 2019-12-26 DIAGNOSIS — Z114 Encounter for screening for human immunodeficiency virus [HIV]: Secondary | ICD-10-CM

## 2019-12-26 LAB — POCT URINALYSIS DIPSTICK
Appearance: NORMAL
Bilirubin, UA: NEGATIVE
Blood, UA: NEGATIVE
Glucose, UA: NEGATIVE
Ketones, UA: NEGATIVE
Leukocytes, UA: NEGATIVE
Nitrite, UA: NEGATIVE
Odor: NORMAL
Protein, UA: NEGATIVE
Spec Grav, UA: 1.01 (ref 1.010–1.025)
Urobilinogen, UA: 0.2 E.U./dL
pH, UA: 6 (ref 5.0–8.0)

## 2019-12-27 LAB — CBC WITH DIFFERENTIAL/PLATELET
Basophils Absolute: 0 10*3/uL (ref 0.0–0.2)
Basos: 1 %
EOS (ABSOLUTE): 0.1 10*3/uL (ref 0.0–0.4)
Eos: 1 %
Hematocrit: 48.1 % (ref 37.5–51.0)
Hemoglobin: 16.7 g/dL (ref 13.0–17.7)
Immature Grans (Abs): 0 10*3/uL (ref 0.0–0.1)
Immature Granulocytes: 0 %
Lymphocytes Absolute: 2.3 10*3/uL (ref 0.7–3.1)
Lymphs: 39 %
MCH: 31.7 pg (ref 26.6–33.0)
MCHC: 34.7 g/dL (ref 31.5–35.7)
MCV: 91 fL (ref 79–97)
Monocytes Absolute: 0.6 10*3/uL (ref 0.1–0.9)
Monocytes: 11 %
Neutrophils Absolute: 2.8 10*3/uL (ref 1.4–7.0)
Neutrophils: 48 %
Platelets: 264 10*3/uL (ref 150–450)
RBC: 5.27 x10E6/uL (ref 4.14–5.80)
RDW: 12.5 % (ref 11.6–15.4)
WBC: 5.9 10*3/uL (ref 3.4–10.8)

## 2019-12-27 LAB — LIPID PANEL
Chol/HDL Ratio: 3 ratio (ref 0.0–5.0)
Cholesterol, Total: 137 mg/dL (ref 100–199)
HDL: 46 mg/dL (ref >39)
LDL Chol Calc (NIH): 69 mg/dL (ref 0–99)
Triglycerides: 120 mg/dL (ref 0–149)
VLDL Cholesterol Cal: 22 mg/dL (ref 5–40)

## 2019-12-27 LAB — COMPREHENSIVE METABOLIC PANEL
ALT: 31 IU/L (ref 0–44)
AST: 20 IU/L (ref 0–40)
Albumin/Globulin Ratio: 1.9 (ref 1.2–2.2)
Albumin: 4.8 g/dL (ref 3.8–4.9)
Alkaline Phosphatase: 78 IU/L (ref 39–117)
BUN/Creatinine Ratio: 16 (ref 9–20)
BUN: 20 mg/dL (ref 6–24)
Bilirubin Total: 0.8 mg/dL (ref 0.0–1.2)
CO2: 22 mmol/L (ref 20–29)
Calcium: 9.8 mg/dL (ref 8.7–10.2)
Chloride: 103 mmol/L (ref 96–106)
Creatinine, Ser: 1.24 mg/dL (ref 0.76–1.27)
GFR calc Af Amer: 74 mL/min/{1.73_m2} (ref >59)
GFR calc non Af Amer: 64 mL/min/{1.73_m2} (ref >59)
Globulin, Total: 2.5 g/dL (ref 1.5–4.5)
Glucose: 95 mg/dL (ref 65–99)
Potassium: 4.1 mmol/L (ref 3.5–5.2)
Sodium: 140 mmol/L (ref 134–144)
Total Protein: 7.3 g/dL (ref 6.0–8.5)

## 2019-12-27 LAB — HEMOGLOBIN A1C
Est. average glucose Bld gHb Est-mCnc: 117 mg/dL
Hgb A1c MFr Bld: 5.7 % — ABNORMAL HIGH (ref 4.8–5.6)

## 2019-12-27 LAB — TSH: TSH: 2.54 u[IU]/mL (ref 0.450–4.500)

## 2019-12-27 LAB — HIV ANTIBODY (ROUTINE TESTING W REFLEX): HIV Screen 4th Generation wRfx: NONREACTIVE

## 2019-12-27 LAB — PSA: Prostate Specific Ag, Serum: 0.8 ng/mL (ref 0.0–4.0)

## 2020-01-02 ENCOUNTER — Telehealth: Payer: Self-pay

## 2020-01-02 NOTE — Telephone Encounter (Signed)
Patient given results through Mychart and has seen Dr. Wonda Olds comments.

## 2020-01-02 NOTE — Telephone Encounter (Signed)
-----   Message from Maple Hudson., MD sent at 01/01/2020 10:23 AM EDT ----- Labs in normal range.

## 2020-11-13 NOTE — Progress Notes (Signed)
This encounter was created in error - please disregard.

## 2020-12-10 ENCOUNTER — Other Ambulatory Visit: Payer: Self-pay | Admitting: Family Medicine

## 2020-12-10 MED ORDER — SILDENAFIL CITRATE 25 MG PO TABS: ORAL_TABLET | ORAL | 13 refills | Status: AC

## 2020-12-10 NOTE — Telephone Encounter (Signed)
Please review. Ok to send in? PA was denied on 12/05/20.

## 2020-12-10 NOTE — Telephone Encounter (Signed)
[facility] the following medications:  sildenafil (VIAGRA) 25 MG tablet  Last Rx: 12/07/19 LOV: 12/26/19 NOV: 12/27/20 Please advise. Thanks TNP

## 2020-12-26 NOTE — Progress Notes (Signed)
I,April Miller,acting as a scribe for Megan Mans, MD.,have documented all relevant documentation on the behalf of Megan Mans, MD,as directed by  Megan Mans, MD while in the presence of Megan Mans, MD.   Complete physical exam   Patient: Lee Sanchez   DOB: 31-Oct-1960   60 y.o. Male  MRN: [id number] Visit Date: 12/27/2020  Today's healthcare provider: Megan Mans, MD   Chief Complaint  Patient presents with  . Annual Exam   Subjective    Lee Sanchez is a 60 y.o. male who presents today for a complete physical exam.  He reports consuming a general diet. Home exercise routine includes walking. He generally feels well. He reports sleeping fairly well. He does not have additional problems to discuss today.  HPI   He complains of knee and calf discomfort at times.  It is not claudication.  Is more stiffness and he complains of risk being stiff at times.  No swelling in either.  Past Medical History:  Diagnosis Date  . Cataract    bilateral  . Finger laceration involving tendon   . S/P foot surgery    Past Surgical History:  Procedure Laterality Date  . FOOT SURGERY Left 2002   Social History   Socioeconomic History  . Marital status: Married    Spouse name: Melissa  . Number of children: 2  . Years of education: some college  . Highest education level: Not on file  Occupational History  . Occupation: Customer service manager    Comment: [employer]  Tobacco Use  . Smoking status: Former Smoker    Types: Cigarettes    Quit date: 08/18/1987    Years since quitting: 33.3  . Smokeless tobacco: Former Neurosurgeon    Quit date: 08/17/1977  . Tobacco comment: Was former social smoker  Vaping Use  . Vaping Use: Never used  Substance and Sexual Activity  . Alcohol use: Yes    Comment: social  . Drug use: No  . Sexual activity: Yes  Other Topics Concern  . Not on file  Social History Narrative  . Not on file   Social  Determinants of Health   Financial Resource Strain: Not on file  Food Insecurity: Not on file  Transportation Needs: Not on file  Physical Activity: Not on file  Stress: Not on file  Social Connections: Not on file  Intimate Partner Violence: Not on file   Family Status  Relation Name Status  . Mother  Deceased  . Father  Deceased at age 6       MI  . Sister  Alive  . Brother  Alive  . Sister  Alive   Family History  Problem Relation Age of Onset  . Asthma Mother   . Heart disease Mother   . Heart attack Father   . Healthy Sister   . Healthy Brother   . Healthy Sister    No Known Allergies  Patient Care Team: Maple Hudson., MD as PCP - General (Family Medicine)   Medications: Outpatient Medications Prior to Visit  Medication Sig  . aspirin 81 MG tablet   . atorvastatin (LIPITOR) 80 MG tablet Take 1 tablet (80 mg total) by mouth daily.  . clopidogrel (PLAVIX) 75 MG tablet Take 75 mg by mouth daily.  . [facility] ASPIRIN ADULT LOW DOSE 81 MG chewable tablet TAKE 1 TABLET BY MOUTH EVERY DAY  . ENBREL SURECLICK 50 MG/ML injection   . sildenafil (  VIAGRA) 25 MG tablet TAKE 1-4 TABLETS (25 MG TOTAL) BY MOUTH AS DIRECTED. *PA DENIED*  . naproxen (NAPROSYN) 500 MG tablet TAKE 1 TABLET (500 MG TOTAL) BY MOUTH 2 (TWO) TIMES DAILY WITH A MEAL. (Patient not taking: No sig reported)   No facility-administered medications prior to visit.    Review of Systems  HENT: Positive for congestion, hearing loss and tinnitus.   Eyes: Positive for visual disturbance.  Musculoskeletal: Positive for arthralgias, back pain and neck stiffness.  Skin: Positive for rash.  All other systems reviewed and are negative.      Objective    BP 122/80 (BP Location: Left Arm, Patient Position: Sitting, Cuff Size: Large)   Pulse 73   Temp 98.1 F (36.7 C) (Oral)   Resp 16   Ht 6\' 1"  (1.854 m)   Wt 206 lb (93.4 kg)   SpO2 93%   BMI 27.18 kg/m  BP Readings from Last 3 Encounters:  12/27/20  122/80  12/26/19 132/88  12/27/18 116/78   Wt Readings from Last 3 Encounters:  12/27/20 206 lb (93.4 kg)  12/26/19 203 lb (92.1 kg)  12/27/18 202 lb (91.6 kg)      Physical Exam Vitals and nursing note reviewed.  Constitutional:      Appearance: Normal appearance. He is well-developed and normal weight.  HENT:     Head: Normocephalic and atraumatic.     Right Ear: Tympanic membrane and external ear normal.     Left Ear: Tympanic membrane normal.     Nose: Nose normal.     Mouth/Throat:     Mouth: Mucous membranes are moist.  Eyes:     General: No scleral icterus.    Conjunctiva/sclera: Conjunctivae normal.  Neck:     Thyroid: No thyromegaly.  Cardiovascular:     Rate and Rhythm: Normal rate and regular rhythm.     Pulses: Normal pulses.     Heart sounds: Normal heart sounds.  Pulmonary:     Effort: Pulmonary effort is normal.     Breath sounds: Normal breath sounds.  Abdominal:     General: Bowel sounds are normal.     Palpations: Abdomen is soft.  Genitourinary:    Penis: Normal.      Testes: Normal.  Musculoskeletal:        General: No swelling or tenderness.     Cervical back: Normal range of motion and neck supple.     Right lower leg: No edema.     Left lower leg: No edema.  Lymphadenopathy:     Cervical: No cervical adenopathy.  Skin:    General: Skin is warm and dry.  Neurological:     Mental Status: He is alert and oriented to person, place, and time. Mental status is at baseline.  Psychiatric:        Mood and Affect: Mood normal.        Behavior: Behavior normal.        Thought Content: Thought content normal.        Judgment: Judgment normal.       Last depression screening scores PHQ 2/9 Scores 12/27/2020 12/26/2019 12/27/2018  PHQ - 2 Score 2 2 0  PHQ- 9 Score 8 8 0   Last fall risk screening Fall Risk  09/10/2016  Falls in the past year? No   Last Audit-C alcohol use screening Alcohol Use Disorder Test (AUDIT) 12/27/2020  1. How often do  you have a drink containing alcohol? 1  2. How  many drinks containing alcohol do you have on a typical day when you are drinking? 0  3. How often do you have six or more drinks on one occasion? 0  AUDIT-C Score 1   A score of 3 or more in women, and 4 or more in men indicates increased risk for alcohol abuse, EXCEPT if all of the points are from question 1   No results found for any visits on 12/27/20.  Assessment & Plan    Routine Health Maintenance and Physical Exam  Exercise Activities and Dietary recommendations Goals   None     Immunization History  Administered Date(s) Administered  . Tdap 04/11/2009, 02/15/2018    Health Maintenance  Topic Date Due  . COVID-19 Vaccine (1) Never done  . COLONOSCOPY (Pts 45-57yrs Insurance coverage will need to be confirmed)  06/16/2019  . INFLUENZA VACCINE  03/18/2021  . TETANUS/TDAP  02/16/2028  . Hepatitis C Screening  Completed  . HIV Screening  Completed  . HPV VACCINES  Aged Out    Discussed health benefits of physical activity, and encouraged him to engage in regular exercise appropriate for his age and condition.  1. Annual physical exam  - CBC with Differential/Platelet - Comprehensive metabolic panel - Lipid panel - TSH - Hemoglobin A1c - CK (Creatine Kinase)  2. Essential hypertension  - CBC with Differential/Platelet - Comprehensive metabolic panel - Lipid panel - TSH - Hemoglobin A1c - CK (Creatine Kinase)  3. Pure hypercholesterolemia  - CBC with Differential/Platelet - Comprehensive metabolic panel - Lipid panel - TSH - Hemoglobin A1c - CK (Creatine Kinase)  4. Borderline diabetes  - CBC with Differential/Platelet - Comprehensive metabolic panel - Lipid panel - TSH - Hemoglobin A1c - CK (Creatine Kinase)  5. Screening for prostate cancer  - PSA 6.  Arthralgia Bilateral wrist and knee stiffness with no physical findings.  No systemic symptoms.  Work-up presently.  May need rheumatologic  work-up or x-rays in the future.  Return in about 1 year (around 12/27/2021).     I, Megan Mans, MD, have reviewed all documentation for this visit. The documentation on 12/29/20 for the exam, diagnosis, procedures, and orders are all accurate and complete.    Georgi Tuel Wendelyn Breslow, MD  [facility] [phone] (phone) [phone] (fax)  [facility]

## 2020-12-27 ENCOUNTER — Other Ambulatory Visit: Payer: Self-pay

## 2020-12-27 ENCOUNTER — Ambulatory Visit (INDEPENDENT_AMBULATORY_CARE_PROVIDER_SITE_OTHER): Payer: Managed Care, Other (non HMO) | Admitting: Family Medicine

## 2020-12-27 ENCOUNTER — Encounter: Payer: Self-pay | Admitting: Family Medicine

## 2020-12-27 VITALS — BP 122/80 | HR 73 | Temp 98.1°F | Resp 16 | Ht 73.0 in | Wt 206.0 lb

## 2020-12-27 DIAGNOSIS — M25531 Pain in right wrist: Secondary | ICD-10-CM

## 2020-12-27 DIAGNOSIS — Z125 Encounter for screening for malignant neoplasm of prostate: Secondary | ICD-10-CM | POA: Diagnosis not present

## 2020-12-27 DIAGNOSIS — R7303 Prediabetes: Secondary | ICD-10-CM

## 2020-12-27 DIAGNOSIS — E7800 Pure hypercholesterolemia, unspecified: Secondary | ICD-10-CM

## 2020-12-27 DIAGNOSIS — Z1389 Encounter for screening for other disorder: Secondary | ICD-10-CM

## 2020-12-27 DIAGNOSIS — I10 Essential (primary) hypertension: Secondary | ICD-10-CM | POA: Diagnosis not present

## 2020-12-27 DIAGNOSIS — M25532 Pain in left wrist: Secondary | ICD-10-CM

## 2020-12-27 DIAGNOSIS — Z0000 Encounter for general adult medical examination without abnormal findings: Secondary | ICD-10-CM | POA: Diagnosis not present

## 2021-01-30 LAB — HEMOGLOBIN A1C
Est. average glucose Bld gHb Est-mCnc: 117 mg/dL
Hgb A1c MFr Bld: 5.7 % — ABNORMAL HIGH (ref 4.8–5.6)

## 2021-01-30 LAB — LIPID PANEL
Chol/HDL Ratio: 5.8 ratio — ABNORMAL HIGH (ref 0.0–5.0)
Cholesterol, Total: 245 mg/dL — ABNORMAL HIGH (ref 100–199)
HDL: 42 mg/dL (ref >39)
LDL Chol Calc (NIH): 168 mg/dL — ABNORMAL HIGH (ref 0–99)
Triglycerides: 188 mg/dL — ABNORMAL HIGH (ref 0–149)
VLDL Cholesterol Cal: 35 mg/dL (ref 5–40)

## 2021-01-30 LAB — COMPREHENSIVE METABOLIC PANEL
ALT: 21 IU/L (ref 0–44)
AST: 13 IU/L (ref 0–40)
Albumin/Globulin Ratio: 1.8 (ref 1.2–2.2)
Albumin: 4.8 g/dL (ref 3.8–4.9)
Alkaline Phosphatase: 72 IU/L (ref 44–121)
BUN/Creatinine Ratio: 11 (ref 10–24)
BUN: 13 mg/dL (ref 8–27)
Bilirubin Total: 0.5 mg/dL (ref 0.0–1.2)
CO2: 20 mmol/L (ref 20–29)
Calcium: 9.7 mg/dL (ref 8.6–10.2)
Chloride: 102 mmol/L (ref 96–106)
Creatinine, Ser: 1.19 mg/dL (ref 0.76–1.27)
Globulin, Total: 2.6 g/dL (ref 1.5–4.5)
Glucose: 105 mg/dL — ABNORMAL HIGH (ref 65–99)
Potassium: 4.1 mmol/L (ref 3.5–5.2)
Sodium: 138 mmol/L (ref 134–144)
Total Protein: 7.4 g/dL (ref 6.0–8.5)
eGFR: 70 mL/min/{1.73_m2} (ref >59)

## 2021-01-30 LAB — CBC WITH DIFFERENTIAL/PLATELET
Basophils Absolute: 0.1 10*3/uL (ref 0.0–0.2)
Basos: 1 %
EOS (ABSOLUTE): 0.1 10*3/uL (ref 0.0–0.4)
Eos: 1 %
Hematocrit: 48.7 % (ref 37.5–51.0)
Hemoglobin: 16.5 g/dL (ref 13.0–17.7)
Immature Grans (Abs): 0 10*3/uL (ref 0.0–0.1)
Immature Granulocytes: 0 %
Lymphocytes Absolute: 2.1 10*3/uL (ref 0.7–3.1)
Lymphs: 34 %
MCH: 31.2 pg (ref 26.6–33.0)
MCHC: 33.9 g/dL (ref 31.5–35.7)
MCV: 92 fL (ref 79–97)
Monocytes Absolute: 0.7 10*3/uL (ref 0.1–0.9)
Monocytes: 12 %
Neutrophils Absolute: 3.3 10*3/uL (ref 1.4–7.0)
Neutrophils: 52 %
Platelets: 278 10*3/uL (ref 150–450)
RBC: 5.29 x10E6/uL (ref 4.14–5.80)
RDW: 13.1 % (ref 11.6–15.4)
WBC: 6.3 10*3/uL (ref 3.4–10.8)

## 2021-01-30 LAB — TSH: TSH: 3.2 u[IU]/mL (ref 0.450–4.500)

## 2021-01-30 LAB — PSA: Prostate Specific Ag, Serum: 1.2 ng/mL (ref 0.0–4.0)

## 2021-01-30 LAB — CK: Total CK: 113 U/L (ref 41–331)

## 2021-12-31 ENCOUNTER — Encounter: Payer: Self-pay | Admitting: Family Medicine
